# Patient Record
Sex: Female | Born: 1945 | Race: White | Hispanic: No | Marital: Single | Smoking: Never smoker
Health system: Southern US, Community
[De-identification: ages and names within clinical notes are randomized; demographics above are authoritative.]

## PROBLEM LIST (undated history)

## (undated) DIAGNOSIS — S060X9A Concussion with loss of consciousness of unspecified duration, initial encounter: Secondary | ICD-10-CM

## (undated) DIAGNOSIS — F0390 Unspecified dementia without behavioral disturbance: Secondary | ICD-10-CM

## (undated) DIAGNOSIS — I10 Essential (primary) hypertension: Secondary | ICD-10-CM

## (undated) DIAGNOSIS — S060XAA Concussion with loss of consciousness status unknown, initial encounter: Secondary | ICD-10-CM

## (undated) DIAGNOSIS — I4719 Other supraventricular tachycardia: Secondary | ICD-10-CM

## (undated) DIAGNOSIS — M1990 Unspecified osteoarthritis, unspecified site: Secondary | ICD-10-CM

## (undated) DIAGNOSIS — I471 Supraventricular tachycardia: Secondary | ICD-10-CM

## (undated) DIAGNOSIS — I495 Sick sinus syndrome: Secondary | ICD-10-CM

## (undated) HISTORY — DX: Essential (primary) hypertension: I10

## (undated) HISTORY — DX: Sick sinus syndrome: I49.5

## (undated) HISTORY — DX: Other supraventricular tachycardia: I47.19

## (undated) HISTORY — DX: Supraventricular tachycardia: I47.1

---
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
## 1968-01-26 HISTORY — PX: BREAST CYST EXCISION: SHX579

## 1970-01-25 HISTORY — PX: BREAST CYST EXCISION: SHX579

## 1987-01-26 HISTORY — PX: CYST EXCISION: SHX5701

## 2001-06-15 ENCOUNTER — Encounter: Payer: Self-pay | Admitting: Emergency Medicine

## 2001-06-15 ENCOUNTER — Emergency Department (HOSPITAL_COMMUNITY): Admission: EM | Admit: 2001-06-15 | Discharge: 2001-06-15 | Payer: Self-pay | Admitting: Emergency Medicine

## 2001-06-28 ENCOUNTER — Emergency Department (HOSPITAL_COMMUNITY): Admission: EM | Admit: 2001-06-28 | Discharge: 2001-06-28 | Payer: Self-pay | Admitting: *Deleted

## 2005-04-15 ENCOUNTER — Ambulatory Visit (HOSPITAL_COMMUNITY): Admission: RE | Admit: 2005-04-15 | Discharge: 2005-04-15 | Payer: Self-pay | Admitting: Pulmonary Disease

## 2005-06-25 ENCOUNTER — Ambulatory Visit (HOSPITAL_COMMUNITY): Admission: RE | Admit: 2005-06-25 | Discharge: 2005-06-25 | Payer: Self-pay | Admitting: Pulmonary Disease

## 2006-05-24 ENCOUNTER — Ambulatory Visit (HOSPITAL_COMMUNITY): Admission: RE | Admit: 2006-05-24 | Discharge: 2006-05-24 | Payer: Self-pay | Admitting: Pulmonary Disease

## 2006-08-14 ENCOUNTER — Emergency Department (HOSPITAL_COMMUNITY): Admission: EM | Admit: 2006-08-14 | Discharge: 2006-08-14 | Payer: Self-pay | Admitting: Emergency Medicine

## 2006-10-04 ENCOUNTER — Encounter: Payer: Self-pay | Admitting: Pulmonary Disease

## 2006-10-05 ENCOUNTER — Inpatient Hospital Stay (HOSPITAL_COMMUNITY): Admission: AD | Admit: 2006-10-05 | Discharge: 2006-10-07 | Payer: Self-pay | Admitting: *Deleted

## 2006-10-06 HISTORY — PX: PERMANENT PACEMAKER INSERTION: SHX6023

## 2006-11-14 ENCOUNTER — Ambulatory Visit (HOSPITAL_COMMUNITY): Admission: RE | Admit: 2006-11-14 | Discharge: 2006-11-14 | Payer: Self-pay | Admitting: Pulmonary Disease

## 2006-11-24 ENCOUNTER — Emergency Department (HOSPITAL_COMMUNITY): Admission: EM | Admit: 2006-11-24 | Discharge: 2006-11-24 | Payer: Self-pay | Admitting: Emergency Medicine

## 2007-10-30 IMAGING — CR DG FINGER THUMB 2+V*R*
1 series · 1 of 1 positions shown · non-contrast
Comparison: None.

CLINICAL DATA: 60-year-old, right wrist and thumb pain.  Fell.
 RIGHT WRIST ? 4 VIEW:

[view not recorded]
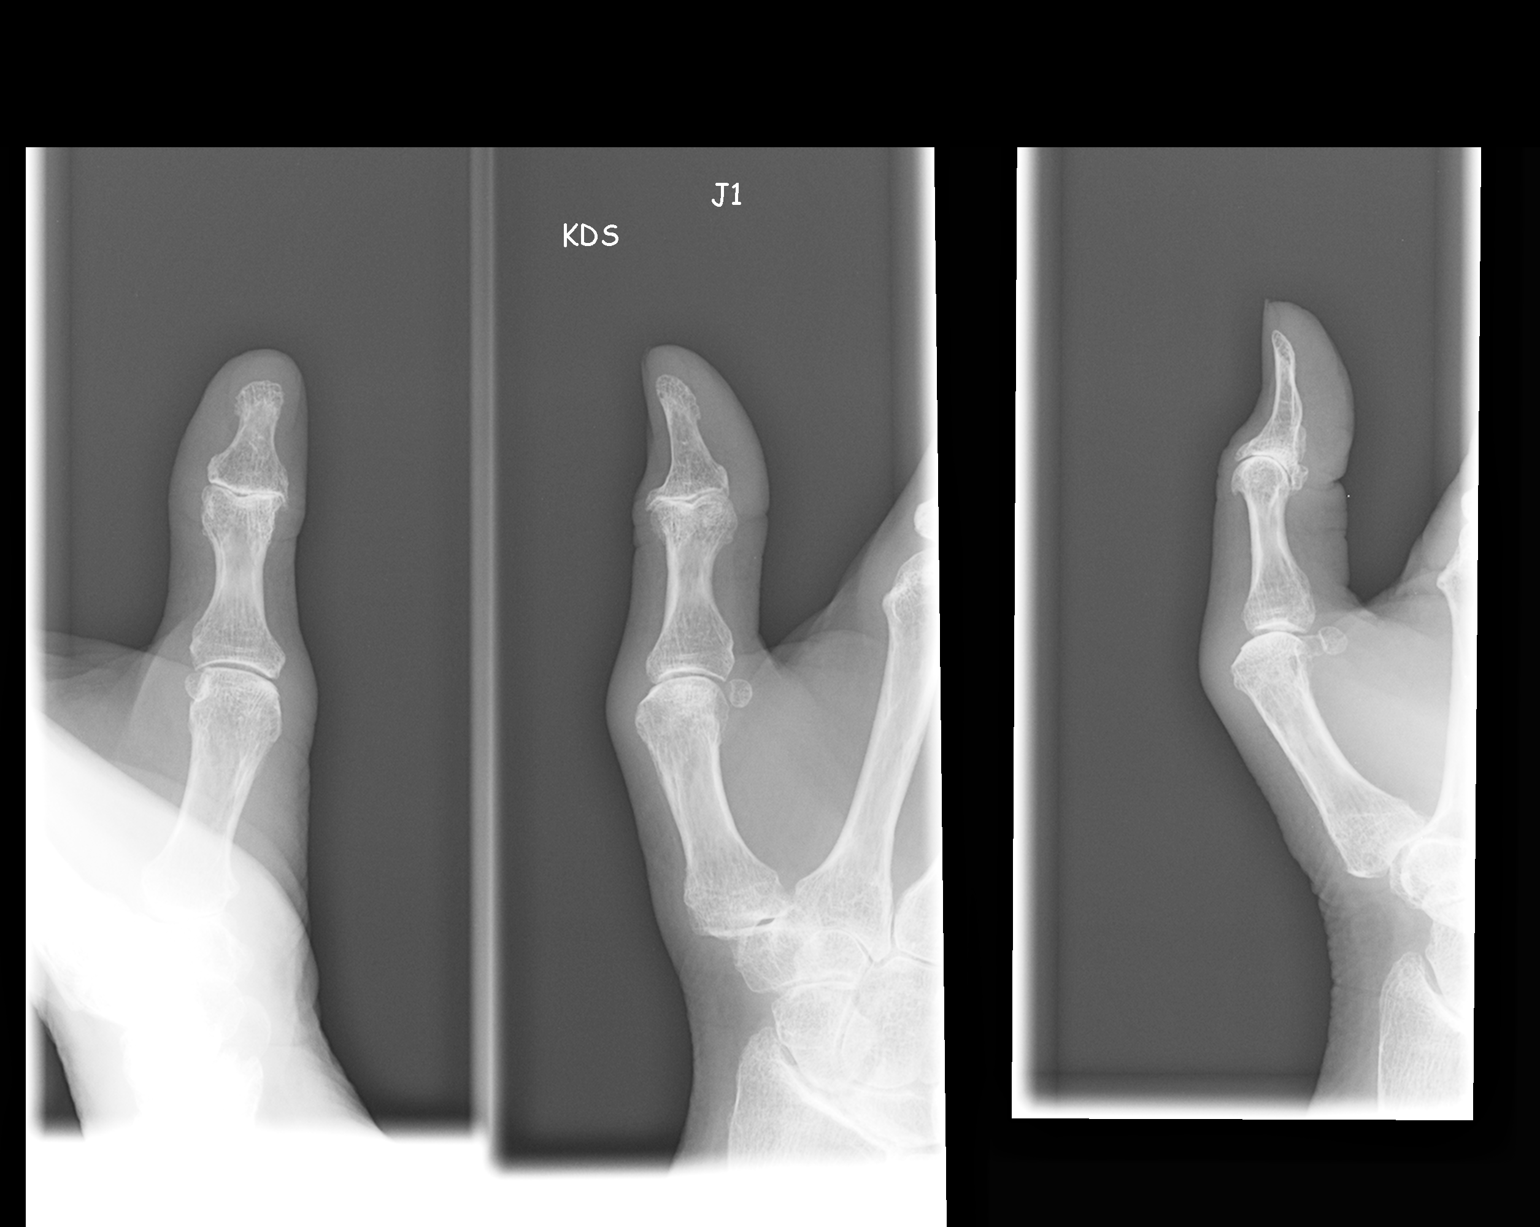

[1 of 1 positions shown; findings below may reference images not displayed]

FINDINGS: There are radiocarpal and intracarpal degenerative changes.  No acute bony findings.   Mild scapholunate joint space widening may suggest ligamentous insufficiency or laxity.  On the lateral film there is volar tilt of the lunate in relation to the capitate and radius which may suggest volar intercalated segmental instability. 
 No acute bony findings.
IMPRESSION: 1.  Degenerative changes as discussed above.
 2.  Volar intercalated segmental instability (VISI).  
 3.  Scapholunate joint space widening suggesting ligamentous laxity or insufficiency.
 4.  No acute bony findings.
 RIGHT THUMB ? 3 VIEW:
FINDINGS: Advanced degenerative change at the interphalangeal joint of the thumb and mild degenerative changes at the carpometacarpal joint of the thumb.   No fractures are seen.
IMPRESSION: Degenerative changes but no acute bony findings.

## 2007-10-30 IMAGING — CR DG WRIST COMPLETE 3+V*R*
2 series · 2 of 2 positions shown · non-contrast
Comparison: None.

CLINICAL DATA: 60-year-old, right wrist and thumb pain.  Fell.
 RIGHT WRIST ? 4 VIEW:

[view not recorded (1 of 2)]
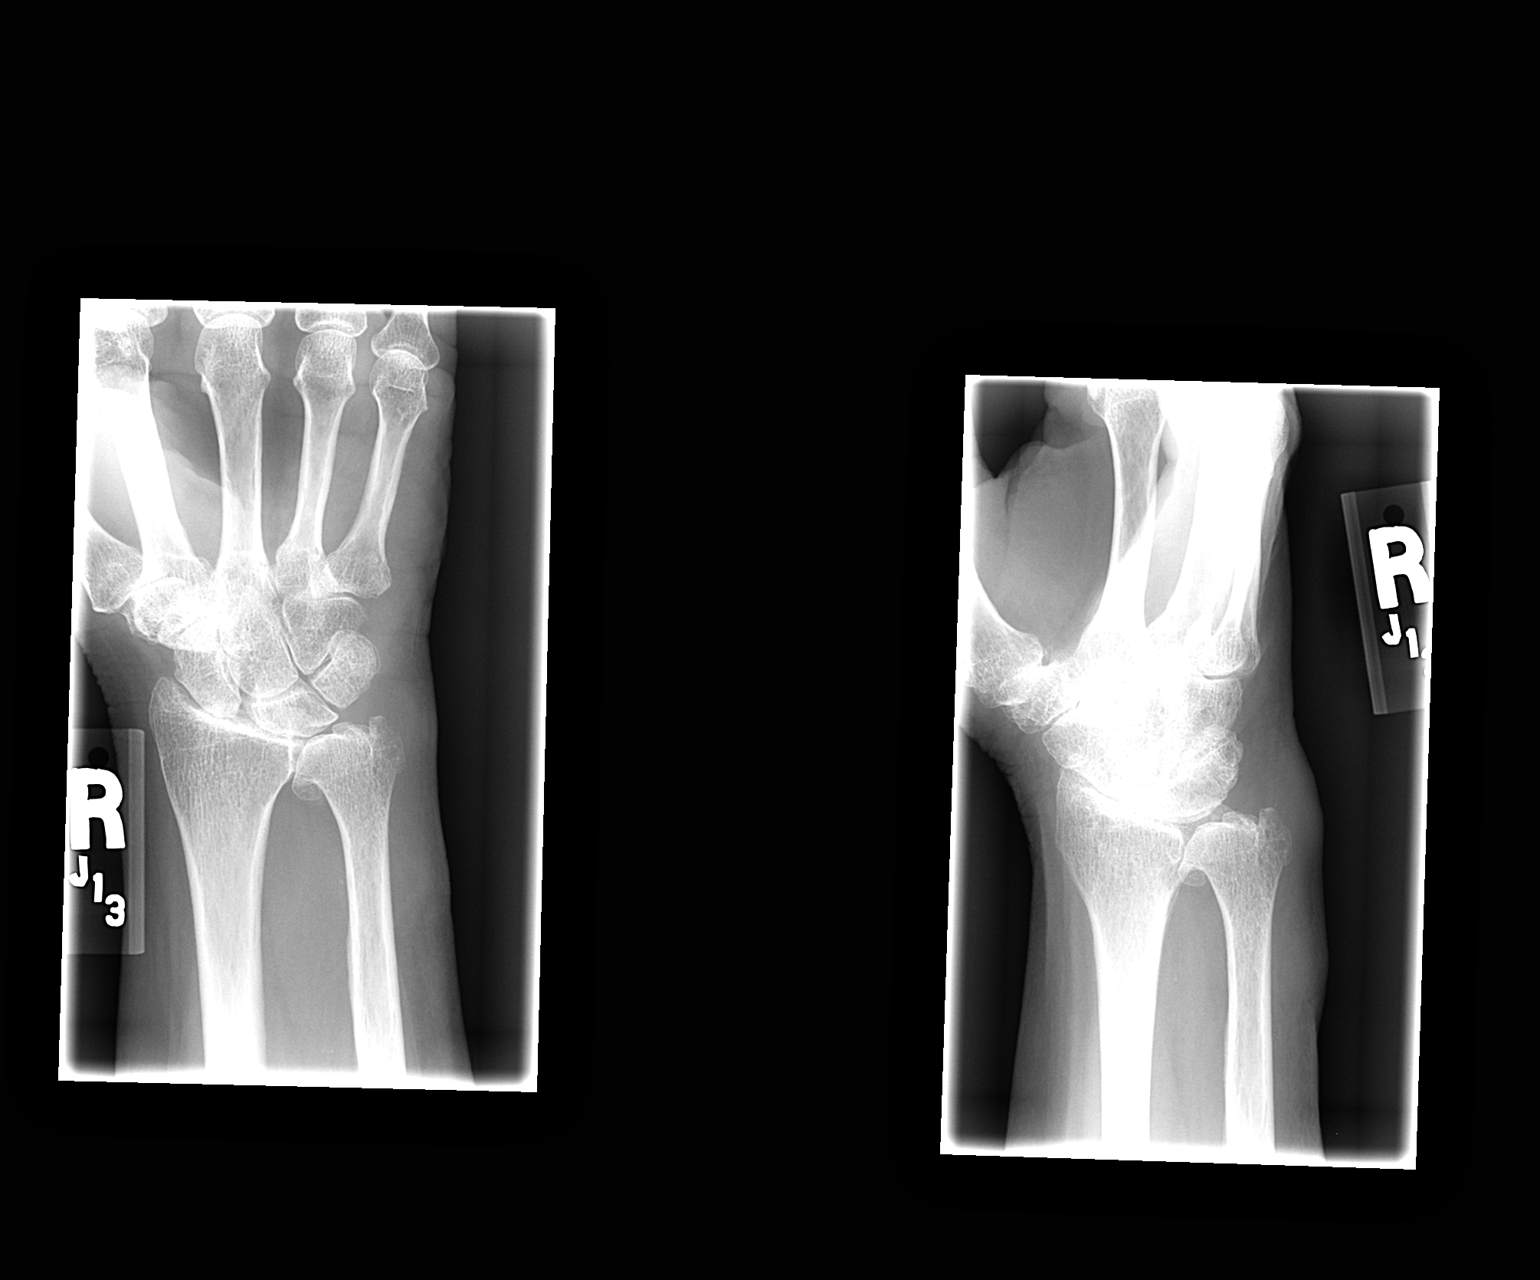

[view not recorded (2 of 2)]
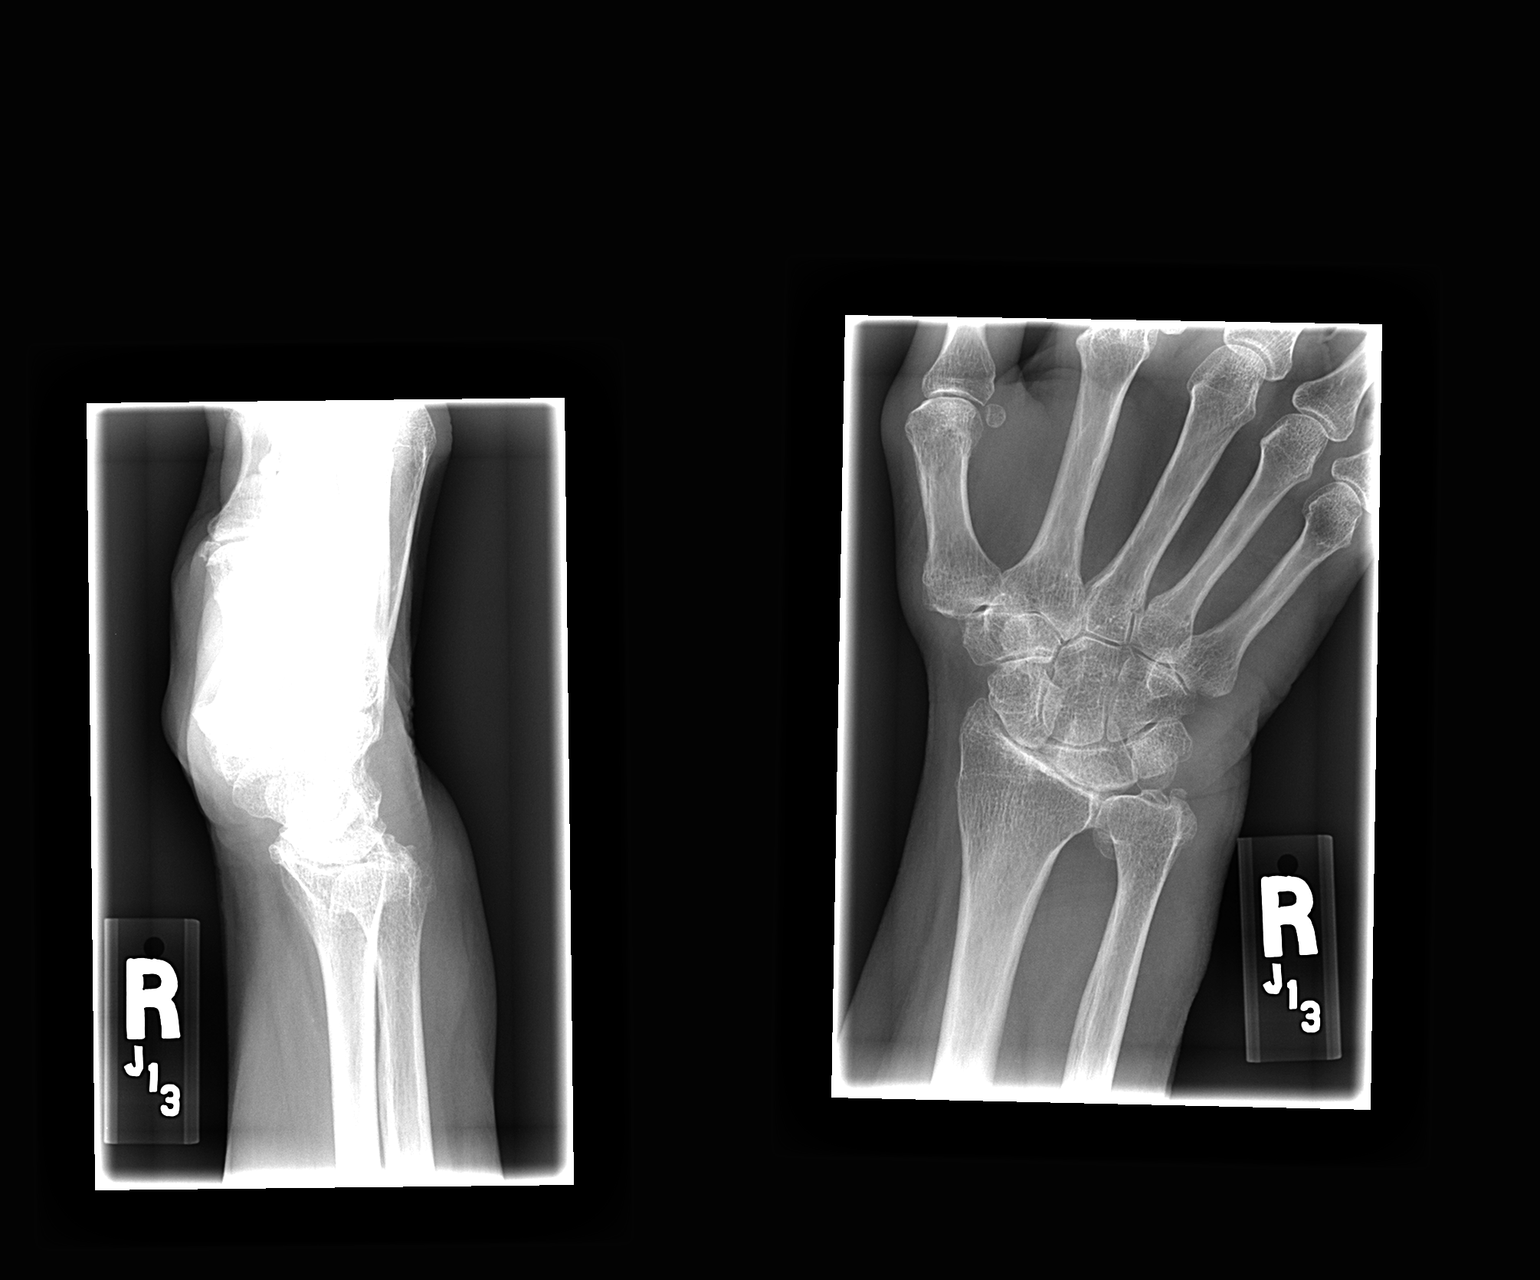

[2 of 2 positions shown; findings below may reference images not displayed]

FINDINGS: There are radiocarpal and intracarpal degenerative changes.  No acute bony findings.   Mild scapholunate joint space widening may suggest ligamentous insufficiency or laxity.  On the lateral film there is volar tilt of the lunate in relation to the capitate and radius which may suggest volar intercalated segmental instability. 
 No acute bony findings.
IMPRESSION: 1.  Degenerative changes as discussed above.
 2.  Volar intercalated segmental instability (VISI).  
 3.  Scapholunate joint space widening suggesting ligamentous laxity or insufficiency.
 4.  No acute bony findings.
 RIGHT THUMB ? 3 VIEW:
FINDINGS: Advanced degenerative change at the interphalangeal joint of the thumb and mild degenerative changes at the carpometacarpal joint of the thumb.   No fractures are seen.
IMPRESSION: Degenerative changes but no acute bony findings.

## 2008-02-09 ENCOUNTER — Ambulatory Visit (HOSPITAL_COMMUNITY): Admission: RE | Admit: 2008-02-09 | Discharge: 2008-02-09 | Payer: Self-pay | Admitting: Pulmonary Disease

## 2008-07-11 ENCOUNTER — Ambulatory Visit (HOSPITAL_COMMUNITY): Admission: RE | Admit: 2008-07-11 | Discharge: 2008-07-11 | Payer: Self-pay | Admitting: Pulmonary Disease

## 2009-06-03 HISTORY — PX: US ECHOCARDIOGRAPHY: HXRAD669

## 2009-06-04 HISTORY — PX: NM MYOCAR PERF WALL MOTION: HXRAD629

## 2009-06-27 ENCOUNTER — Inpatient Hospital Stay (HOSPITAL_COMMUNITY)
Admission: EM | Admit: 2009-06-27 | Discharge: 2009-07-03 | Payer: Self-pay | Source: Home / Self Care | Admitting: Emergency Medicine

## 2009-07-01 ENCOUNTER — Ambulatory Visit: Payer: Self-pay | Admitting: Gastroenterology

## 2009-07-02 ENCOUNTER — Ambulatory Visit: Payer: Self-pay | Admitting: Internal Medicine

## 2009-07-02 ENCOUNTER — Encounter (INDEPENDENT_AMBULATORY_CARE_PROVIDER_SITE_OTHER): Payer: Self-pay | Admitting: Pulmonary Disease

## 2009-08-26 ENCOUNTER — Encounter (INDEPENDENT_AMBULATORY_CARE_PROVIDER_SITE_OTHER): Payer: Self-pay | Admitting: *Deleted

## 2009-08-27 ENCOUNTER — Encounter (INDEPENDENT_AMBULATORY_CARE_PROVIDER_SITE_OTHER): Payer: Self-pay

## 2009-09-16 ENCOUNTER — Ambulatory Visit: Payer: Self-pay | Admitting: Internal Medicine

## 2009-09-16 ENCOUNTER — Encounter: Payer: Self-pay | Admitting: Gastroenterology

## 2009-09-16 DIAGNOSIS — R740 Nonspecific elevation of levels of transaminase and lactic acid dehydrogenase [LDH]: Secondary | ICD-10-CM

## 2009-09-16 DIAGNOSIS — K279 Peptic ulcer, site unspecified, unspecified as acute or chronic, without hemorrhage or perforation: Secondary | ICD-10-CM | POA: Insufficient documentation

## 2009-09-16 DIAGNOSIS — K210 Gastro-esophageal reflux disease with esophagitis: Secondary | ICD-10-CM

## 2009-09-16 DIAGNOSIS — R7401 Elevation of levels of liver transaminase levels: Secondary | ICD-10-CM | POA: Insufficient documentation

## 2009-09-16 DIAGNOSIS — K449 Diaphragmatic hernia without obstruction or gangrene: Secondary | ICD-10-CM | POA: Insufficient documentation

## 2009-09-30 ENCOUNTER — Telehealth (INDEPENDENT_AMBULATORY_CARE_PROVIDER_SITE_OTHER): Payer: Self-pay

## 2009-12-08 ENCOUNTER — Encounter (INDEPENDENT_AMBULATORY_CARE_PROVIDER_SITE_OTHER): Payer: Self-pay

## 2009-12-10 ENCOUNTER — Telehealth (INDEPENDENT_AMBULATORY_CARE_PROVIDER_SITE_OTHER): Payer: Self-pay

## 2009-12-10 ENCOUNTER — Encounter: Payer: Self-pay | Admitting: Gastroenterology

## 2009-12-24 ENCOUNTER — Encounter: Payer: Self-pay | Admitting: Internal Medicine

## 2009-12-29 ENCOUNTER — Telehealth (INDEPENDENT_AMBULATORY_CARE_PROVIDER_SITE_OTHER): Payer: Self-pay

## 2010-01-07 ENCOUNTER — Telehealth (INDEPENDENT_AMBULATORY_CARE_PROVIDER_SITE_OTHER): Payer: Self-pay

## 2010-01-08 ENCOUNTER — Telehealth (INDEPENDENT_AMBULATORY_CARE_PROVIDER_SITE_OTHER): Payer: Self-pay

## 2010-01-08 ENCOUNTER — Ambulatory Visit (HOSPITAL_COMMUNITY)
Admission: RE | Admit: 2010-01-08 | Discharge: 2010-01-08 | Payer: Self-pay | Source: Home / Self Care | Attending: Internal Medicine | Admitting: Internal Medicine

## 2010-02-24 NOTE — Miscellaneous (Signed)
Summary: Orders Update  Clinical Lists Changes  Orders: Added new Test order of T-Hepatic Function ([id number]) - Signed 

## 2010-02-24 NOTE — Progress Notes (Signed)
Summary: phone note  Phone Note Call from Patient   Caller: Patient Reason for Call: Refill Medication Summary of Call: PATIENT STATES THAT LESLIE LEWIS GAVE HER SAMPLES OF NEXIUM BECAUSE THE OTHER MEDICATION PANTOPRAZOLE MADE HER HAVE HEART BURN AND THE NEXIUM IS WORKING AND WOULD LIKE TO HAVE A PRESCRIPTION FOR NEXIUM CALLED INTO HER PHARMACY WHICH IS [facility] IN [place].  Initial call taken by: Rosine Beat,  September 30, 2009 2:47 PM     Appended Document: phone note-rx nexium    Prescriptions: NEXIUM 40 MG CPDR (ESOMEPRAZOLE MAGNESIUM) one by mouth 30 mins before breakfast daily  #30 x 11   Entered and Authorized by:   Leanna Battles. Dixon Boos   Signed by:   Leanna Battles Dixon Boos on 10/03/2009   Method used:   Electronically to        [facility] (retail)       [address]       [place], [place]  [address]       Ph: [phone]       Fax: [phone]   RxID:   [id number]

## 2010-02-24 NOTE — Progress Notes (Signed)
Summary: phone note/ do LFT's and triaged for EGD  Phone Note Call from Patient   Caller: Patient Summary of Call: Pt  called for plan of care. She is supposed to do LFT's. I am mailing lab order to her and she will do at PCP's next week. Also,  triaged for EGD, will let Dr. Jena Gauss sign off and call her back with date if she doesn't need OV first.   Initial call taken by: Cloria Spring LPN,  December 10, 2009 2:26 PM

## 2010-02-24 NOTE — Letter (Signed)
Summary: Plan of Care, Need to Discuss  [facility]  [address]   [place], [place] [address]   Phone: [phone]  Fax: [phone]    December 08, 2009  Melanie [name] [address] [place], [place]  [address] [birth year]   Dear [name],   We are writing this letter to inform you of treatment plans and/or discuss your plan of care.  We have tried several times to contact you; however, we have yet to reach you.  We ask that you please contact our office for follow-up on your gastrointestinal issues.  We can  be reached at [phone] to schedule an appointment, or to speak with someone regarding your health care needs.  Please do not neglect your health.   Sincerely,    Cloria Spring LPN  [facility] Ph: [phone]    Fax: [phone]

## 2010-02-24 NOTE — Letter (Signed)
Summary: EGD ORDER/TRIAGE  EGD ORDER/TRIAGE   Imported By: Rexene Alberts 12/24/2009 12:25:46  _____________________________________________________________________  External Attachment:    Type:   Image     Comment:   External Document

## 2010-02-24 NOTE — Progress Notes (Signed)
Summary: phone note/ rescheduled EGD  Phone Note Call from Patient   Caller: Patient Summary of Call: Pt called and wanted to reschedule her EGD from 01/06/2010. She is rescheduled at 1:15pm on 01/08/2010. Changed in IDX and LMOM for Kim in Endo. Initial call taken by: Cloria Spring LPN,  December 29, 2009 11:30 AM

## 2010-02-24 NOTE — Miscellaneous (Signed)
Summary: CT A/P 6/11  CT Abd/Pelvis W CM - STATUS: Final  IMAGE                                     Perform Date: 7 Jun11 10:29  Ordered By: Juanetta Gosling MD , Oneal Deputy         Ordered Date: 7 Jun11 08:05  Facility: [facility]                               Department: CT  Service Report Text  [facility] Accession Number: [id number]      Clinical Data: Confusion.  Fever.  Weight loss and diarrhea.    CT ABDOMEN AND PELVIS WITH CONTRAST    Technique:  Multidetector CT imaging of the abdomen and pelvis was   performed following the standard protocol during bolus   administration of intravenous contrast.    Contrast: 100 ml Omnipaque-300    Comparison: CT scan 08/14/2006    Findings: The lung bases are clear except for a small left effusion   and minimal overlying atelectasis.  There is a large stable hiatal   hernia.    There is diffuse fatty infiltration of the liver but no focal   hepatic lesions or biliary dilatation.  Gallbladder appears normal.   The pancreas, spleen, adrenal glands and kidneys are unremarkable   and stable.    The stomach, duodenum and small bowel are unremarkable.  There are   patchy areas of colonic inflammation involving portions of the   ascending, transverse and descending colon.  Some involvement of   the rectosigmoid area is also noted.  This could reflect   inflammatory bowel disease or an infectious process.    The aorta is normal in caliber.  The major branch vessels are   normal.  No mesenteric or retroperitoneal masses or adenopathy.    The uterus and ovaries are unremarkable.  The bladder appears   normal.  No pelvic mass or adenopathy.  No inguinal mass or hernia.    The bony structures are unremarkable.    IMPRESSION:    1.  Segmental areas of inflammation involving the colon may reflect   inflammatory bowel disease or infectious process.   2.  Small left effusion and minimal overlying atelectasis.   3.  Large hiatal hernia.   4.  Diffuse fatty  infiltration of the liver.   5.  No mass lesions or adenopathy.    Read By:  Cyndie Chime,  M.D.   Released By:  Cyndie Chime,  M.D.  Additional Information  HL7 RESULT STATUS : F  External image : [id number]  External IF Update Timestamp : 2009-07-01:10:54:28.000000 Clinical Lists Changes

## 2010-02-24 NOTE — Op Note (Signed)
Summary: EGD/TCS report  NAME:  Melanie [name], Melanie [name]               ACCOUNT NO.:  [id number]      MEDICAL RECORD NO.:  [id number]         PATIENT TYPE:  PINP      LOCATION:  A206                          FACILITY:  [facility]      PHYSICIAN:  R. Roetta Sessions, M.D. DATE OF BIRTH:  25-Mar-1945      DATE OF PROCEDURE:   DATE OF DISCHARGE:                                  OPERATIVE REPORT      PROCEDURE:  Esophagogastroduodenoscopy with gastric biopsy, followed by   ileocolonoscopy, biopsy, and stool sampling.      INDICATIONS FOR PROCEDURE:  A 63-year lady with anorexia, weight loss,   history of NSAID use, recent diarrhea, CT suggesting a patchy colitis.      LABORATORY DATA:  From this morning:  She is not anemic with hemoglobin   and hematocrit of 12.5 and 37.0, white count 8.4.  Lactoferrin on stool   came back positive.  C. diff came back negative and a culture   reincubated for better growth.  Mildly elevated aminotransferase is   noted today with AST and ALT of 63 and 39 respectively, total bilirubin   1.1.  H. pylori serologies came back negative at 0.4.      EGD and colonoscopy are now being done.  Risks, benefits, alternatives,   and limitations have been discussed and questions answered.  Please see   the documentation in the medical record.      PROCEDURE NOTE:  O2 saturation, blood pressure, pulse, and respirations   were monitored throughout the entire procedure.  Conscious sedation:   Versed 4 mg IV, Demerol 100 mg IV in divided doses.  Cetacaine spray for   topical oropharyngeal anesthesia.  Instrument:  Pentax video chip   system.      FINDINGS:  EGD:  Examination of tubular esophagus revealed   circumferential distal esophageal erosions coming up 1 to 2 cm from the   GE junction, consistent with erosive reflux esophagitis.  There was a   noncritical Schatzki ring present.  Otherwise tubular esophagus appeared   unremarkable.  The GE junction remained widely patent and was  easily   traversed with the scope.      Stomach:  The gastric cavity insufflated well with air.  Thorough   examination of gastric mucosa, including retroflexion of the proximal   stomach and esophagogastric junction demonstrated a moderately large   hiatal hernia.  The patient had gastric erosions involving the antrum   and the cardia up in the hernia sac.  There was 1 area that was actually   a 3-mm area of ulceration in the prepyloric antral mucosa.  I did not   see an infiltrating process or other abnormality.  The pylorus was   patent, easily traversed.  The mucosa of the bulb was diffusely eroded   without ulcer or infiltrating process seen.  Otherwise D1 and D2   appeared unremarkable.  Therapeutic/diagnostic maneuver was performed.   The area of inflammation in the antrum and up in the cardia were   biopsied for  histologic study.  The patient tolerated the procedure well   and was prepared for colonoscopy.      Digital rectal exam revealed no abnormalities.      Endoscopic findings:  Prep was adequate.      Colon:  The colonic mucosa was surveyed from the rectosigmoid junction   through the left, transverse, right colon to the appendiceal orifice and   ileocecal valve/cecum.  These structures were well seen and photographed   for the record.  The terminal ileum was intubated to 10 cm.  From this   level, the scope was slowly and cautiously withdrawn.  All previously   mentioned mucosal surfaces were again seen.  The colonic mucosa really   looked good.  There were some areas where the mucosa appeared somewhat   pale in the descending and ascending segments, but there were no   erosions, ulcers, polyp disease, or evidence of neoplasia.  There was   minimal increased friability.  There was, however, preservation of the   normal vascular pattern throughout the colon.  I did not see any   granularity whatsoever.  A stool sample was submitted for a second C.   diff toxin assay and  repeat stool culture.  I elected to perform   segmental biopsies of the ascending and descending segments.  Otherwise   colonic mucosa appeared unremarkable.  The scope was pulled down into   the rectum, where thorough examination of the rectal mucosa, including   retroflexed view of the anal verge demonstrated normal-appearing mucosa.   I elected to biopsy the rectal mucosa as well.  The patient tolerated   both procedures well.  Cecal withdrawal time 8 minutes.      IMPRESSIONS:   1. Circumferential distal esophageal erosions consistent with erosive       reflux esophagitis, noncritical ring, not manipulated, moderately       large hiatal hernia, small ulcer in the prepyloric antral mucosa       with satellite erosions, gastric erosions in the cardia as well,       status post biopsy.  Patent pylorus.  Bulbar erosions.  Otherwise       D1 and D2 appeared normal.   2. Colonoscopy findings:  Normal rectum, subtly pale mucosa involving       the ascending and descending segments.  This is really not very       impressive.  Otherwise unremarkable rectum and colon, status post       segmental biopsy and stool sampling.  Normal terminal ileum.      The abnormalities seen in the gastric mucosa as well as the colon could   easily be explained by intermittent NSAID use.  A resolving infectious   process is certainly also possible.      RECOMMENDATIONS:   1. Follow up on stool studies and path.   2. Low-residue diet.   3. Hepatic function profile to follow up on her elevated AST and ALT       tomorrow morning.   4. Begin acid suppression therapy with a PPI.   5. Further recommendations to follow.               Jonathon Bellows, M.D.   Appended Document: EGD/TCS report need consult note into emr  Appended Document: EGD/TCS report in emr

## 2010-02-24 NOTE — Miscellaneous (Signed)
Summary: consult note  Clinical Lists Changes  NAME:  Melanie [name], Melanie [name]               ACCOUNT NO.:  [id number]      MEDICAL RECORD NO.:  [id number]          PATIENT TYPE:  INP      LOCATION:  A206                          FACILITY:  [facility]      PHYSICIAN:  Lorenza Burton, N.P.    DATE OF BIRTH:  01/25/1946      DATE OF CONSULTATION:   DATE OF DISCHARGE:                                    CONSULTATION      REQUESTING PHYSICIAN:  Dr. Darrick Penna.      PRIMARY CARE PHYSICIAN:  Dr. Juanetta Gosling.      REASON FOR CONSULTATION:  Anorexia, weight loss and acute diarrhea.      HISTORY OF PRESENT ILLNESS:  Melanie [name] is a 65 year old Caucasian   female.  She has history of rheumatoid arthritis and is under treatment   by Dr. Azzie Roup.  She had been on a steady drug that she called   DFOS for approximately 6 weeks now.  She was found by police driving   circles and quite confused.  She has amnesia and no recollection of what   she was doing at that time.  She believes she was headed to [employer] to   get some potting soil.  She tells me she had a fever of 103.  She has   also been quite fatigued over the last couple of weeks.  She did have   some transient thrombocytopenia.  Over the last week, she has developed   diarrhea.  She is having anywhere from 3-4 loose nonbloody stools daily.   She denies any abdominal pain.  Denies any nausea, vomiting.  Denies any   headache.  She has had anorexia.  She believes she has lost about 8-10   pounds over the last year.  This is without change in diet or activity   level.  She occasionally has heartburn and indigestion.  She takes   Rolaids if needed very sporadically.  She also takes an as-needed Aleve   for her rheumatoid arthritis but tells me this is very rare.  She is on   chronic methotrexate for her RA as well.  She denies any dysphagia or   odynophagia.  She did find a tick in her hair approximately 5 days ago.   She has not developed a rash.  She tells me  it was not attached.  She   denies any other new medications.      PAST MEDICAL HISTORY:  Rheumatoid arthritis followed by Dr. Azzie Roup on study drug called DFOS for the last 6 weeks.  She did have   transient thrombocytopenia felt to be related to this.  She is followed   by Dr. Lynnea Ferrier, [facility], with a history of pacemaker   placement and Stokes-Adams attack.  She has never had a colonoscopy.   She has had benign breast biopsies bilaterally.  She had an appendectomy   in seventh grade.      MEDICATIONS PRIOR TO ADMISSION:  Folic acid 1 mg  q.h.s., methotrexate   2.5 mg eight weekly, Aleve 220 mg one to two b.i.d. p.r.n., generally   only takes one once weekly, DFOS study drug.      ALLERGIES:  HYDROCODONE CAUSES NAUSEA AND VOMITING.      FAMILY HISTORY:  There is no known family history of colorectal   carcinoma.  She did lose a brother at age 75 with esophageal carcinoma.   She has one sister with history of coronary artery disease who is alive.   She lost her mother in her 76s with history of RA.  Father deceased in   his 66s with coronary artery disease.      SOCIAL HISTORY:  Ms. Such is single.  She has never been married.  She   lives alone.  She does not have any children.  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  She denies any   tobacco use.  She occasionally consumes an alcoholic beverage socially.   Denies any drug use.      REVIEW OF SYSTEMS:  See HPI.  Over the last couple of weeks, she has had   heaviness to both of her lower extremities.  She did report this to her   rheumatologist.  She also had transient thrombocytopenia.  She has had   some bruising.  CONSTITUTIONAL:  She has had some fatigue and malaise.   Otherwise, negative review of systems.      PHYSICAL EXAMINATION:  VITAL SIGNS:  Temperature 98.9, pulse 78,    respirations 18, blood pressure 115/68, O2 SAT 92% on room air.   GENERAL:  She is a pale-appearing Caucasian female who is alert,   oriented, pleasant and cooperative, and in no acute distress.   HEENT:  Sclerae clear.  Conjunctiva pale.  Oropharynx pink and moist.   NECK:  Supple without any masses or thyromegaly.   CHEST:  Heart rate is irregular.  She has a pacemaker in place.   LUNGS:  Clear to auscultation bilaterally.   ABDOMEN:  Positive bowel sounds x4.  No bruits auscultated.  Abdomen is   soft, nontender, nondistended without palpable mass or   hepatosplenomegaly.  No rebound tenderness or guarding.   EXTREMITIES:  She does have trace pretibial edema.  She does have   chronic arthritic changes in her hands; also, her left AC has   ecchymosis, erythema and slight warmth.      LABORATORY STUDIES:  From June 28, 2009, she had a hemoglobin of 12.8,   hematocrit 37.5, platelets 157,000.  White blood cell count 7.4, calcium   7.7, sodium 135, potassium 3.1, chloride 106, CO2 21, BUN 5, creatinine   0.79 and glucose 101.  Stool cultures negative from June 4 thus far,   blood culture no growth for 3 days.  Urine culture no growth.  Lactic   acid was 1.7.      IMAGING STUDIES:  She had a noncontrast head CT on June 27, 2009;   progressive small vessel white matter ischemic changes in both cerebral   hemispheres and stable mild diffuse cerebral and cerebellar atrophy.  No   acute changes.  She had a portable chest x-ray June 27, 2009; nonspecific   upper limit of normal caliber, left upper quadrant small bowel loops.      IMPRESSION:  Melanie [name] is a 65 year old Caucasian female who  was   admitted with an episode of global amnesia.  She is being worked up by   Dr. Gerilyn Pilgrim with  possible complex partial seizures, cerebral ischemic   event or medication effect from her study drug DFOS.  She has had an   approximate 8-10 pound weight loss in the last year, anorexia, and a 1-   week  history of acute diarrheal febrile illness.  She also has a history   of tick exposure but no evidence of rash or attachment, making this less   likely.  She has never had a colonoscopy nor EGD.  Given her history of   weight loss and anorexia, she is going to need further workup.  She is   scheduled for CT scan of abdomen and pelvis with contrast today.   Pending these findings, would consider complete workup with colonoscopy   and EGD by Dr. Jonette Eva which can probably be done tomorrow if   nothing to explain her diarrhea illness and weight loss is found on CT.   Differentials are wide for her diarrhea which include side effect of   study drug, acute bacterial or viral illness, celiac disease.   Colorectal carcinoma would be less likely and side effect from   medication remains high on the differential.      PLAN:   1. Check CMP and CBC in the morning.  We will follow up on CT scan of       the abdomen and pelvis with contrast, which is being done today.   2. Plan on colonoscopy and EGD in the near future, possibly tomorrow       pending CT results.   3. Continue supportive measures.      Thank you for allowing [place] to participate in the care of Ms. Inabinet.               Lorenza Burton, N.P.            KJ/MEDQ  D:  07/01/2009  T:  07/01/2009  Job:  [id number]      cc:   Ramon Dredge L. Juanetta Gosling, M.D.   Fax: [phone]      Kofi A. Gerilyn Pilgrim, M.D.   Fax: [phone]      Ritta Slot, MD   Fax: [phone]

## 2010-02-24 NOTE — Assessment & Plan Note (Signed)
Summary: hos fu with extender to review gi issues per RMR/ss   Visit Type:  f/u  Primary Care Provider:  Shaune [name]  Chief Complaint:  follow up from hosp.  History of Present Illness: Melanie [name] is here for f/u visit. In 6/11, she presented to [facility] with fever 103, confusion. CT suggest colitis. She has been on new med for six weeks for RA, DFOS, in addition to her methotrexate. She had EGD/TCS.  She had circumferential distal esophageal erosions consistent with erosive reflux esophagitis, noncritical ring, not manipulated, moderately large hiatal hernia, small ulcer in the prepyloric antral mucosa with satellite erosions, gastric erosions in the cardia as well, status post biopsy.  Patent pylorus.  Bulbar erosions.  Otherwise D1 and D2 appeared normal.  Colonoscopy findings:  Normal rectum, subtly pale mucosa involving the ascending and descending segments.  This is really not very impressive. Otherwise unremarkable rectum and colon, status post segmental biopsy and stool sampling.  Normal terminal ileum.  The abnormalities seen in the gastric mucosa as well as the colon could easily be explained by intermittent NSAID use.  A resolving infectious process certainly also possible. Bx all negative. Four stools cultures done, one grew Candida. Three negative C.Diffs.     Feels buch better now. BM regular. Some days, diminshed appetite, but other days real good. No N/V. No regurgitation. Pantoprazole "caused" heartburn so she stopped it. No dysphagia/odynophagia. DFOS stopped after hospitalization. Sees Dr. Dareen [name] on Monday. Dr. Dareen [name] usually checks LFTs. To her knowledge, never up before.   Current Medications (verified): 1)  Methotrexate 2.5 Mg Tabs (Methotrexate Sodium) .... 8 Q Week 2)  Folic Acid 1 Mg Tabs (Folic Acid) .... Once Daily  Allergies (verified): No Known Drug Allergies  Past History:  Past Medical History: Rheumatoid arthritis followed by Dr. Azzie [name]  Followed by Dr.  Lynnea [name], [facility], with a history of pacemaker placement and Stokes-Adams attack.    EGD/TCS 6/11--> Circumferential distal esophageal erosions consistent with erosive reflux esophagitis, noncritical ring, not manipulated, moderately large hiatal hernia, small ulcer in the prepyloric antral mucosa  with satellite erosions, gastric erosions in the cardia as well, status post biopsy (benign). Colonoscopy findings:  Normal rectum, subtly pale mucosa involving the ascending and descending segments.  Otherwise unremarkable rectum and colon, status post segmental biopsy and stool sampling (unremarkable).  Normal terminal ileum.      Past Surgical History: Benign breast biopsies bilaterally.  Appendectomy in seventh grade.   Family History: There is no known family history of colorectal carcinoma.  She did lose a brother at age 39 with esophageal carcinoma.  She has one sister with history of coronary artery disease who is alive. She lost her mother in her 82s with history of RA.  Father deceased in his 56s with coronary artery disease.      Social History:  [name] is single.  She has never been married.  She lives alone.  She does not have any children.  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  She denies any tobacco use.  She occasionally consumes an alcoholic beverage socially.  Denies any drug use.   Review of Systems      See HPI  Vital Signs:  Patient profile:   65 year old female Height:      60 inches Weight:      120 pounds BMI:  23.52 Temp:     97.6 degrees F oral Pulse rate:   76 / minute BP sitting:   108 / 78  (left arm) Cuff size:   regular  Vitals Entered By: Melanie [name] (September 16, 2009 9:04 AM)  Physical Exam  General:  Well developed, well nourished, no acute distress. Head:  Normocephalic and atraumatic. Eyes:  sclera nonicteric Mouth:   Oropharyngeal mucosa moist, pink.  No lesions, erythema or exudate.    Lungs:  Clear throughout to auscultation. Heart:  Regular rate and rhythm; no murmurs, rubs,  or bruits. Abdomen:  Bowel sounds normal.  Abdomen is soft, nontender, nondistended.  No rebound or guarding.  No hepatosplenomegaly, masses or hernias.  No abdominal bruits.  Extremities:  No clubbing, cyanosis, edema or deformities noted. Neurologic:  Alert and  oriented x4;  grossly normal neurologically. Skin:  Intact without significant lesions or rashes. Psych:  Alert and cooperative. Normal mood and affect.  Impression & Recommendations:  Problem # 1:  PUD (ICD-533.90)  Prepyloric ulcer possibly secondary to nsaids. Bx negative. She did not take pantoprazole due to "side effect of reflux". She needs to take PPI for at least two months. Avoid NSAIDS if possible. Start Nexium 40mg  by mouth daily. Samples provided, if tolerated she will call for RX. #20 given. Will discuss with Melanie [name], regarding if she needs to have EGD in three months to verify healing.   Orders: Est. Patient Level III (29562)  Problem # 2:  ESOPHAGITIS, REFLUX (ICD-530.11)  Assymptomatic. PPI for now. Unclear how long she will need treatement.   Orders: Est. Patient Level III (13086)  Problem # 3:  TRANSAMINASES, SERUM, ELEVATED (ICD-790.4) Mildly elevated during hospitalization. Will recheck and go from there. She plans to have labs done at Dr. Ewell [name] office on Monday. I have asked that she had copy sent to [place].  Orders: T-Hepatic Function [id number]) Est. Patient Level III (28413)  Appended Document: hos fu with extender to review gi issues per RMR/ss Never received LFTs. Check with Dr. Vincenza [name] Anderson's office, pt planned to have done there.  Appended Document: hos fu with extender to review gi issues per RMR/ss Melanie [name], Does she need f/u EGD to verify healing of small prepyloric ulcer? How long should she be on PPI? She had erosive  reflux esophagitis (without heartburn symptoms). Need chronic PPI?  Appended Document: hos fu with extender to review gi issues per RMR/ss yes  Appended Document: hos fu with extender to review gi issues per RMR/ss Requested LFTs again  Appended Document: hos fu with extender to review gi issues per RMR/ss No LFTs available from Dr. Ewell [name]. Please have patient do LFTs (secondary abnormal LFTs).  She also needs EGD to f/u PUD per RMR.   Appended Document: hos fu with extender to review gi issues per RMR/ss LMOM to call.  Appended Document: hos fu with extender to review gi issues per RMR/ss LMOM to call.  Appended Document: hos fu with extender to review gi issues per RMR/ss Letter mailed to call for plan of care.

## 2010-02-26 NOTE — Progress Notes (Signed)
----   Converted from flag ---- ---- 01/07/2010 2:54 PM, Jonathon Bellows MD, Caleen Essex wrote: no solid food after 6 PM day before procedure. Okay to have clear liquids until 10 AM date of procedure  ---- 01/07/2010 2:47 PM, Cloria Spring LPN wrote: Pt had to change her EGD to 3:00 tomorrow because of the OR schedule. She wants to know if she can have solid foods alittle  later than 6 pm and what time she can have last liquids? ------------------------------  Appended Document:  LMOM for pt to call, she can have clear liquids til 10 AM.  Appended Document:  Pt returned call and she said she did not eat after 6 pm last evening. She is aware she can have clear liquids until 10 AM today if she goes for the 3 PM appt.

## 2010-02-26 NOTE — Progress Notes (Signed)
Summary: phone note/ change in time for ENDO on 15th  Phone Note Other Incoming   Caller: [facility] Summary of Call: Selena Batten called and ask me to get in touch with pt and ask her to come in at 2:00 pm tomorrow and her appt would be at 3:00. Had to make some changes due to add on in OR earlier in the day. I LMOM for pt to do so, and to call and confirm that she got the message. Initial call taken by: Cloria Spring LPN,  January 07, 2010 2:32 PM

## 2010-04-13 LAB — CULTURE, BLOOD (ROUTINE X 2)
Culture: NO GROWTH
Report Status: 6082011
Report Status: 6082011

## 2010-04-13 LAB — CBC
HCT: 34.5 % — ABNORMAL LOW (ref 36.0–46.0)
Hemoglobin: 13.4 g/dL (ref 12.0–15.0)
MCHC: 33.7 g/dL (ref 30.0–36.0)
MCHC: 33.9 g/dL (ref 30.0–36.0)
MCV: 99.4 fL (ref 78.0–100.0)
MCV: 99.9 fL (ref 78.0–100.0)
Platelets: 157 10*3/uL (ref 150–400)
Platelets: 157 10*3/uL (ref 150–400)
Platelets: 188 10*3/uL (ref 150–400)
RBC: 3.47 MIL/uL — ABNORMAL LOW (ref 3.87–5.11)
RDW: 17.6 % — ABNORMAL HIGH (ref 11.5–15.5)
RDW: 17.9 % — ABNORMAL HIGH (ref 11.5–15.5)
RDW: 18.2 % — ABNORMAL HIGH (ref 11.5–15.5)
WBC: 5.1 10*3/uL (ref 4.0–10.5)
WBC: 7.4 10*3/uL (ref 4.0–10.5)

## 2010-04-13 LAB — H. PYLORI ANTIBODY, IGG: H Pylori IgG: 0.4 {ISR}

## 2010-04-13 LAB — URINE CULTURE
Colony Count: NO GROWTH
Culture: NO GROWTH

## 2010-04-13 LAB — URINALYSIS, ROUTINE W REFLEX MICROSCOPIC
Glucose, UA: NEGATIVE mg/dL
Leukocytes, UA: NEGATIVE
Protein, ur: NEGATIVE mg/dL
Urobilinogen, UA: 0.2 mg/dL (ref 0.0–1.0)

## 2010-04-13 LAB — COMPREHENSIVE METABOLIC PANEL
ALT: 39 U/L — ABNORMAL HIGH (ref 0–35)
Albumin: 2.4 g/dL — ABNORMAL LOW (ref 3.5–5.2)
Alkaline Phosphatase: 124 U/L — ABNORMAL HIGH (ref 39–117)
Calcium: 7.9 mg/dL — ABNORMAL LOW (ref 8.4–10.5)
GFR calc Af Amer: >60 mL/min (ref >60)
Potassium: 3.8 mEq/L (ref 3.5–5.1)
Sodium: 137 mEq/L (ref 135–145)
Total Protein: 5.5 g/dL — ABNORMAL LOW (ref 6.0–8.3)

## 2010-04-13 LAB — BASIC METABOLIC PANEL
BUN: 5 mg/dL — ABNORMAL LOW (ref 6–23)
BUN: 8 mg/dL (ref 6–23)
Chloride: 106 mEq/L (ref 96–112)
Chloride: 106 mEq/L (ref 96–112)
Creatinine, Ser: 0.79 mg/dL (ref 0.4–1.2)
Creatinine, Ser: 0.9 mg/dL (ref 0.4–1.2)
GFR calc Af Amer: >60 mL/min (ref >60)
Glucose, Bld: 101 mg/dL — ABNORMAL HIGH (ref 70–99)
Glucose, Bld: 122 mg/dL — ABNORMAL HIGH (ref 70–99)
Potassium: 3.5 mEq/L (ref 3.5–5.1)

## 2010-04-13 LAB — DIFFERENTIAL
Basophils Absolute: 0 10*3/uL (ref 0.0–0.1)
Basophils Absolute: 0 10*3/uL (ref 0.0–0.1)
Basophils Relative: 0 % (ref 0–1)
Basophils Relative: 0 % (ref 0–1)
Basophils Relative: 0 % (ref 0–1)
Eosinophils Absolute: 0 10*3/uL (ref 0.0–0.7)
Eosinophils Absolute: 0 10*3/uL (ref 0.0–0.7)
Eosinophils Relative: 0 % (ref 0–5)
Lymphs Abs: 0.7 10*3/uL (ref 0.7–4.0)
Lymphs Abs: 0.7 10*3/uL (ref 0.7–4.0)
Lymphs Abs: 1.7 10*3/uL (ref 0.7–4.0)
Monocytes Absolute: 0.3 10*3/uL (ref 0.1–1.0)
Monocytes Relative: 4 % (ref 3–12)
Neutrophils Relative %: 88 % — ABNORMAL HIGH (ref 43–77)

## 2010-04-13 LAB — STOOL CULTURE

## 2010-04-13 LAB — CLOSTRIDIUM DIFFICILE EIA
C difficile Toxins A+B, EIA: NEGATIVE
C difficile Toxins A+B, EIA: NEGATIVE
C difficile Toxins A+B, EIA: NEGATIVE

## 2010-04-13 LAB — URINE MICROSCOPIC-ADD ON

## 2010-04-13 LAB — LACTIC ACID, PLASMA: Lactic Acid, Venous: 1.7 mmol/L (ref 0.5–2.2)

## 2010-04-13 LAB — FECAL LACTOFERRIN, QUANT: Fecal Lactoferrin: NEGATIVE

## 2010-06-09 NOTE — Discharge Summary (Signed)
Melanie [name], Melanie [name]               ACCOUNT NO.:  [id number]   MEDICAL RECORD NO.:  [id number]          PATIENT TYPE:  OBV   LOCATION:  A209                          FACILITY:  [facility]   PHYSICIAN:  Edward L. Juanetta Gosling, M.D.DATE OF BIRTH:  [birth year]   DATE OF ADMISSION:  10/04/2006  DATE OF DISCHARGE:  09/10/2008LH                               DISCHARGE SUMMARY   FINAL DISCHARGE DIAGNOSES:  1. Syncope, with marked sinus bradycardia.  2. Rheumatoid arthritis.  3. Previous episodes of syncope, without definite diagnosis.  4. Chronic methotrexate use.   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  She  simply fell straight to the ground.  She suffered an injury to her  forehead and to her wrist.   Additional history is that she did not have any seizure-like activity.  She did not have any other neurological abnormalities associated with  this episode.   PHYSICAL EXAMINATION:  VITAL SIGNS:  Temperature was 98.7, pulse 60,  respirations 20, O2 saturation was 99% on room air.  GENERAL:  She was awake and alert, fully intact neurologically.  She did  not have any orthostatic blood pressure changes.  She did have a bandage  on her forehead because of the injury.   Her initial lab work, EKG, etc., in the emergency room were normal.   HOSPITAL COURSE:  She was admitted, monitored, and during the night that  she was admitted to the hospital her heart rate dropped into the 30s,  and she did have some symptoms associated with this.  It promptly  resolved.  It appeared to be a sinus bradycardia.  She had consultation  with Dr. Elsie Lincoln of the [facility] and, after he  evaluated her she was transferred to [facility] with the  possibility of having a pacemaker.  She is going to start by having  echocardiography and further evaluation as directed by Dr. Elsie Lincoln.      Edward L. Juanetta Gosling, M.D.  Electronically Signed     ELH/MEDQ  D:  10/06/2006  T:  10/07/2006  Job:  [id number]

## 2010-06-09 NOTE — Op Note (Signed)
NAMEMARQUELLE, BALOW NO.:  [id number]   MEDICAL RECORD NO.:  [id number]          PATIENT TYPE:  INP   LOCATION:  2031                         FACILITY:  [facility]   PHYSICIAN:  Darlin Priestly, MD  DATE OF BIRTH:  1945-03-12   DATE OF PROCEDURE:  10/06/2006  DATE OF DISCHARGE:                               OPERATIVE REPORT   PROCEDURE:  Insertion of a Medtronic Adapta ADDR generator, serial  [id number] with active atrial and ventricular leads.   COMPLICATIONS:  None.   INDICATION:  Ms. Baranowski is a 65 year old female, patient of Dr. Shaune Pollack, with a history of hyperlipidemia, history of recurrent syncope,  recently admitted with another syncopal episode and found to have  intermittent episodes of significant bradycardia, with the rates in the  30s.  She is now brought for dual chamber pacer implant for treatment of  recurrent syncope.   DESCRIPTION OF PROCEDURE:  After obtaining informed consent, the patient  was brought into the cardiac catheterization laboratory where her left  chest was prepped and draped in the sterile fashion.  ECG monitoring was  established.  Lidocaine 1% was then used to anesthetize the left mid  subclavicular line.  Next, approximately a 3 cm horizontal mid  infraclavicular incision was carried out and hemostasis was obtained  with electrocautery.  Blunt dissection was used to carry this down to  the left pectoral fascia. Next, approximately a 3 x 4-cm pocket was then  created over the left pectoral fascia.  Again, hemostasis was obtained  with electrocautery.  The left subclavian vein was then easily entered  with 2 retained guide wires.  A 4-0 silk suture was then placed at the  base of the retained guide wires into the pectoral fascia.  Over the  first retained guide wire, a 7-French dilator and sheath were then  easily passed into the left subclavian vein and the dilator and guide  wire were removed.  Through this, a 52 cm  active Medtronic lead model  #5076, serial [id number] was then passed into the right atrium, peel-  away sheath was removed.  Over the second retained guide wire, a second  7-French dilator and sheath were then passed into the left subclavian  vein and the dilator and guide wire were removed.  Through this, a 45 cm  active Medtronic lead model #5076, serial [id number] was then passed  into the right atrium and the peel-away sheath was removed.  A J shape  was then placed in the ventricular lead stylet and the ventricular lead  was allowed to prolapse to the tricuspid valve and then positioned in  the RV apex without difficulty.  We were able to capture 2 volts, and  the screw was then extended and thresholds were then determined.  R  waves were measured at 10.2 millivolts.  Impedance at 1,090 Ohms.  Threshold is 0.5 volts at 0.5 milliseconds.  Current is 0.9 milliamps.  Two volts negative for diaphragmatic stimulation.  The atrial stylet was  then removed and a preformed atrial J stylet was inserted in the atrial  lead  and the atrial lead was then positioned in the right atrial  appendage.  We were able to capture 2 volts and the screw was then  extended and thresholds were then determined.  P waves were measured at  2.6 millivolts.  Impedance 1,140 Ohms.  Threshold in the atrial lead is  1.1 volts at 0.5 milliseconds.  Current is 1 milliamp.  Ten volts  negative for diaphragmatic stimulation.  The leads were then secured in  place with 2-0 silk sutures per lead to anchor these to the pectoral  fascia.  The pocket was then copiously irrigated with 1% kanamycin  solution.  Again, hemostasis was confirmed.  The leads were then  connected in a serial fashion to a Medtronic Adapta ADDRO1 generator,  serial [id number].  Head screws were tightened and pacing was  confirmed.  A single silk suture was then placed in the superior apex of  the pocket.  The generator and leads were then delivered  into the  pocket.  The header was secured to the silk suture.  Subcutaneous layers  were then closed with running 2-0 Vicryl.  The skin is then closed using  4-0 Vicryl.  Steri-Strips were then applied.  The patient was returned  to the recovery room in stable condition.   CONCLUSIONS:  Successful implant of a Medtronic Adapta ADDRO1 generator,  serial [id number] with active atrial and ventricular leads.      Darlin Priestly, MD  Electronically Signed     RHM/MEDQ  D:  10/06/2006  T:  10/06/2006  Job:  [id number]   cc:   Ramon Dredge L. Juanetta Gosling, M.D.  Madaline Savage, M.D.

## 2010-06-09 NOTE — Discharge Summary (Signed)
Melanie [name], ENGELBRECHT NO.:  [id number]   MEDICAL RECORD NO.:  [id number]          PATIENT TYPE:  INP   LOCATION:  2031                         FACILITY:  [facility]   PHYSICIAN:  Darlin Priestly, MD  DATE OF BIRTH:  07/25/1945   DATE OF ADMISSION:  10/05/2006  DATE OF DISCHARGE:                               DISCHARGE SUMMARY   DISCHARGE DIAGNOSES:  1. Syncopal episode secondary to symptomatic bradycardia.  2. 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  3. Hyperlipidemia.  She had been taking Lipitor but stopped about 2      weeks ago because it made her feel bad.  4. Rheumatoid arthritis- treated.   This is a 65 year old female who was admitted to [facility] on  October 04, 2006 after she had 3 episodes of syncope.  The last episode  was on the day of her presentation, without any real prodromal symptoms.  She was talking to her neighbors, and then simply fell down.  She  bruised her nose, lacerated her head, and had some abrasions on the  right thumb and both knees.  The neighbors said that she did not even  put her arms out, and simply went straight down.  The patient had 2  previous episodes of syncope before.  She was seen by Dr. Pearlean Brownie,  neurologist, and she had full workup, but no definitive diagnosis was  made.  This time, the patient was admitted to the telemetry unit and  placed on a monitor.  Cardiology consultation was ordered, Dr. Elsie Lincoln  saw the patient and suspected the syncope could be secondary to cardiac  causes, and her heart rate went down as low as in the 30s with some  symptoms of dizziness and nausea.   Based on these findings, the patient was scheduled for implantation of  permanent transvenous pacemaker.   HOSPITAL PROCEDURE:  Dual-chamber pacemaker insertion was performed by  Dr. Jenne Campus on October 06, 2006.  He inserted Medtronic device,  Adaptor generator serial number [id number],  atrial lead serial number  [id number], and ventricular lead serial number [id number].   The patient tolerated the procedure well.  The next morning the device  was interrogated by pacer representative, and no adjustments were made.  Normal functioning was found.  The patient underwent chest x-ray, the  results are still pending.  Given negative results, the patient will be  discharged home.   While in the hospital, she underwent 2D echocardiogram.  It was done at  [facility], and the result is not entered in the computer  system, and I do not have any type result available to me right now.   HOSPITAL LABORATORY DATA:  TSH was mildly elevated at 5.681.  This will  need to be followed by primary care physician.  CBC revealed white blood  cell count 6.6, hemoglobin 12.5, hematocrit 36.7, and platelet count  142,000.  BMP showed sodium of 142, potassium 3.6, chloride 110, CO2 27,  glucose 75, BUN 9, creatinine 0.75.  Random cortisol was 6.8, pro time  15.0,  INR 1.2.  Cardiac panel was negative.   DISCHARGE MEDICATIONS:  1. Folic acid 1 mg daily.  2. Methotrexate weekly, the same dose.  3. Aspirin 81 mg daily, start tomorrow.   DISCHARGE INSTRUCTIONS:  The patient was instructed not to wet the  incision site up until seen in the office.  She will increase activity  slowly, avoid lifting overhead, reaching, vigorous movements and  driving, and vigorous movements with left arm and also avoid driving.   Our office will contact the patient to schedule pacer site checkup.      Raymon Mutton, P.A.      Darlin Priestly, MD  Electronically Signed    MK/MEDQ  D:  10/07/2006  T:  10/07/2006  Job:  [id number]   cc:   Ramon Dredge L. Juanetta Gosling, M.D.  [facility] [place]  [facility], [place]

## 2010-06-09 NOTE — Consult Note (Signed)
Melanie [name]               ACCOUNT NO.:  [id number]   MEDICAL RECORD NO.:  [id number]          PATIENT TYPE:  OBV   LOCATION:  A209                          FACILITY:  [facility]   PHYSICIAN:  Kofi A. Gerilyn Pilgrim, M.D. DATE OF BIRTH:  09/21/1945   DATE OF CONSULTATION:  10/05/2006  DATE OF DISCHARGE:  10/05/2006                                 CONSULTATION   REASON FOR CONSULTATION:  Seizure.   HISTORY OF PRESENT ILLNESS:  This is a 65 year old white female who had  3 spells of syncope and presyncopal episodes. The symptomatology seems  all different in each 3 events. She does not report having any real  prodromal symptoms other than probably the most recent one. She did  report having some nausea in the first event a few weeks ago, but the  other 2 spells have not been associated with nausea. The most recent  event happened on the day before admission when she developed somewhat  of an unusual sensation involving the right occipital region and she  simply fell to the ground and passed out hitting the front of the head.  She was probably out for about 15 seconds. She denies any focal  numbness, weakness, chest pain, shortness of breath, tonic or clonic  activity. She also denies any urinary or bowel incontinence. The other 2  previous spells were more presyncopal episode with the patient slumping  to the ground and not entirely losing consciousness.   She was worked up for other events by her PCP and also it appears that  she saw Dr. Drue Second the vascular neurologist in [place]. The patient  indicated that she may have had some Doppler studies and MRI although I  do not see those results in the computer. She also reported that she may  have had MRI of the brain done in April of this year, it was  unremarkable. CT scan done on admission this time around is also  unremarkable. There is some generalized atrophy but otherwise  unrevealing.   PAST MEDICAL HISTORY:  1. Rheumatoid  arthritis.  2. Hyperlipidemia.  3. Syncopal episodes.   PAST SURGICAL HISTORY:  1. Lumpectomy.  2. Appendectomy.   ADMISSION MEDICATIONS:  1. Methotrexate.  2. Folic acid.  3. Aspirin.  4. Lipitor, but she discontinued this 2 weeks ago because she just did      not feel right.   SOCIAL HISTORY:  She is a retired Engineer, site. No history of tobacco,  alcohol or illicit drug use.   REVIEW OF SYSTEMS:  Stated in history of present illness, otherwise  unrevealing. Again, she really does not report any lightheadedness or  dizziness before these events.   PHYSICAL EXAMINATION:  VITAL SIGNS: Blood pressure 142/71, pulse 60,  respirations 18, temperature 96.9.  HEENT: On evaluation she has a bruise on the forehead which is bandaged.  NECK: The neck is supple.  ABDOMEN: The abdomen is soft.  EXTREMITIES: No significant edema.  MENTATION: She is awake and alert. She converses well. Speech, language  and cognition are intact.  NEUROLOGICAL: Cranial nerve evaluation, pupils are equally  round and  reactive to light and accommodation. Extra-ocular movements are full. No  nystagmus is noted. Visual fields are intact. Facial movements are  symmetric. The tongue is midline, uvula is midline. Shoulder shrugs are  normal. Motor examination shows normal tone, bulk and strength.  Coordination shows no dysmetria, tremors or past-pointing. Reflexes are  preserved with plantar reflexes going down. Sensation is normal to  temperature, light touch.   LABORATORY DATA:  Urinalysis, nitrites negative, leukocyte esterase  small.   CPK 91. Cortisol level random 10. Sodium 137, potassium 3, chloride 105,  CO2 25, glucose 127, BUN 12, creatinine 0.8, calcium 8.5.   ASSESSMENT:  Recurrent syncope with no stereotyped phenomenon suggestive  of seizures. The etiology is unlikely to be neurologic in nature. I  doubt primary ischemia although carotid disease bilaterally could be an  issue.    RECOMMENDATIONS:  1. She has a cardiology consult that is pending.  2. She actually did have orthostatics and they are negative.  3. EEG.  4. Carotid Dopplers.  5. Antiplatelet agents.   Thanks for this consultation.      Kofi A. Gerilyn Pilgrim, M.D.  Electronically Signed     KAD/MEDQ  D:  10/05/2006  T:  10/05/2006  Job:  [id number]

## 2010-06-09 NOTE — H&P (Signed)
Melanie [name], Melanie [name]               ACCOUNT NO.:  [id number]   MEDICAL RECORD NO.:  [id number]          PATIENT TYPE:  OBV   LOCATION:  A209                          FACILITY:  [facility]   PHYSICIAN:  Edward L. Juanetta Gosling, M.D.DATE OF BIRTH:  06/22/1945   DATE OF ADMISSION:  10/04/2006  DATE OF DISCHARGE:  LH                              HISTORY & PHYSICAL   REASON FOR ADMISSION:  Syncope.   HISTORY OF PRESENT ILLNESS:  Ms. Gunnells is a 65 year old who has now had  three episodes of syncope.  The last today was without any real  prodromal symptoms.  She says that she was talking to neighbors and she  simply fell down.  She has a bruised nose, a laceration on her head,  abrasions to her right thumb and to both knees.  She was talking with a  neighbor and they said that she did not even put her arms out, they said  she simply went straight down.  She she has had two previous episodes.  She is actually seen Dr. Pearlean Brownie, the neurologist, about this, and he has  had her worked up, and no definitive diagnosis was made.   PAST MEDICAL HISTORY:  1. Syncopal episodes.  2. Rheumatoid arthritis.  3. Hyperlipidemia.  She had been taking Lipitor, but stopped it about      2 weeks ago because she felt bad with it.   SURGICAL HISTORY:  She has had an appendectomy, lumpectomy.   SOCIAL HISTORY:  She is retired Engineer, site.  She does not smoke.  She does not drink any alcohol.  She does not use any illicit drugs.   FAMILY HISTORY:  Positive for cardiac disease in her father,  hyperlipidemia in multiple family members.   MEDICATIONS:  1. Aspirin 325 mg daily.  2. Folic acid 1 mg daily.  3. Methotrexate once a week.  I am not sure of that dose.   REVIEW OF SYSTEMS:  Other than as mentioned, she has not noticed any  other problems.  She has not been dizzy.  She has had no changes in her  mental status, otherwise.   PHYSICAL EXAMINATION:  GENERAL:  A well-developed, well-nourished female  who is in  no acute distress.  VITAL SIGNS:  Temperature 96.9, pulse 63, respirations are 18, blood  pressure 142/71, O2 sats 98%.  She has said no orthostatic changes.  HEENT:  She has a laceration on her face, laceration on her nose.  Nose  and throat are clear.  Her mucous membranes are moist.  CHEST:  Her chest is clear.  HEART:  Regular.  ABDOMEN: Soft.  EXTREMITIES:  Showed no edema.  CENTRAL NERVOUS SYSTEM: Examination is grossly intact.  Tympanic  membranes are intact.  She does not have any bruits.  Her heart is  regular with normal rate she does not have a gallop.  She does not have  a murmur.   LABORATORY WORK:  B-met is normal.  CBC shows a normal white count 6900,  hemoglobin is 113.3.  She had a CT of the brain which was negative.  ASSESSMENT:  She has syncope.  It is not at all clear what is happening  to her.  This last episode sounds like it may well be a cardiac problem,  but it is not totally clear.  I am going to have her get a cardiac  panel, get a cardiology consultation.  She is going to have  echocardiogram.  I am going to have her get a cortisol level and then  follow.      Edward L. Juanetta Gosling, M.D.  Electronically Signed     ELH/MEDQ  D:  10/04/2006  T:  10/05/2006  Job:  [id number]

## 2010-06-09 NOTE — Consult Note (Signed)
Melanie [name], Melanie [name]               ACCOUNT NO.:  [id number]   MEDICAL RECORD NO.:  [id number]          PATIENT TYPE:  OBV   LOCATION:  A209                          FACILITY:  [facility]   PHYSICIAN:  Melanie [name], Melanie AshleyDATE OF BIRTH:  [birth year]   DATE OF CONSULTATION:  10/05/2006  DATE OF DISCHARGE:  10/05/2006                                 CONSULTATION   CLINICAL PROBLEM:  Syncopal episode with marked sinus bradycardia.   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  This patient was admitted for an  episode of syncope.   HISTORY:  The patient reports that she had an episode that lasted  approximately 15 minutes with syncope.  She felt a full sensation up  in  her throat and then felt faint and dropped right into the floor,  suffering an abrasion to both her forehead and her left wrist.  The  first episode of syncope occurred in February of this year that was  similar, but not as severe.  A second episode has occurred sometime in  the fall or early summer and the episode prompting hospitalization  presently occurred as described earlier.  The patient is now at bed rest  and has a rhythm of sinus rhythm and episodes of sinus bradycardia and  is fully alert, coherent and able to give a clear-cut history.   Pertinent to the above history, the patient has seen Melanie [name],  neurologist, in the past for evaluation of a syncopal episode and  apparently transcranial Dopplers and carotid Dopplers were performed  and, although those results are not present and available for review,  the patient, who is a reliable historian, reports that these tests were  not remarkable.   The patient's other main medical problem is rheumatoid arthritis.  She  sees Melanie [name] for that and she is on methotrexate for it.   FAMILY HISTORY:  Both father and brother died  with cancer.   SOCIAL HISTORY:  The patient has a sister, who is present at the current  time, here at [facility].  For other social history,  please see patient's history and physical, dictated by Melanie [name].  No drug abuse, nondrinker, nonsmoker.   REVIEW OF SYSTEMS:  The patient has been taking Lipitor prophylactically  for high cholesterol, but stopped it because of apparent medication side  effects.   PHYSICAL EXAMINATION:  Patient's temperature was 98.7, pulse was 60,  respirations 20, O2 saturations 99% on room air.  This is an intelligent, good historian, who appears her stated age of 65  years.  There is a bandage over her forehead.  EARS, EYES, NOSE AND THROAT:  Are not remarkable.  NECK:  Reveals no carotid bruits, thyromegaly, adenopathy, or JVD.  LUNGS:  Clear, both anteriorly and posteriorly.  No rales, wheezes,  rhonchi or consolidation noted.  CARDIAC EXAM:  Suboptimal exam, but no murmurs heard, no gallops, no  rubs.  ABDOMEN:  Soft without organomegaly.  EXTREMITIES:  The patient has typical ulnar deviation of rheumatoid  arthritis, but it is mild.   EKG shows sinus rhythm at a rate of 60 a minute.  It is basically a  normal tracing.  QT interval normal.  There is a rhythm strip, performed  on October 04, 2006, at 2018 hours, showing sinus bradycardia at a rate  of 30 a minute and a second strip, done about 33 seconds later, showing  a heart rate of 36.   FINAL IMPRESSIONS:  Recurrent syncopal episodes with forehead and wrist  injury.   PLAN:  The patient will need to be transferred to [facility] to  a telemetry monitor.  I have spoken to Melanie [name], my associate,  and we have tentatively scheduled patient for a permanent pacemaker  tomorrow, October 06, 2006, at [facility].  We will transfer  her there today by [facility] and get an echocardiogram later this  afternoon.            ______________________________  Melanie [name], M.D.     WHG/MEDQ  D:  10/05/2006  T:  10/05/2006  Job:  [id number]   cc:   Billee Cashing, MD   Oneal Deputy Juanetta Gosling, M.D.  Fax: [phone]   Medical Records [facility]

## 2010-06-09 NOTE — Procedures (Signed)
Melanie [name], Melanie [name]               ACCOUNT NO.:  [id number]   MEDICAL RECORD NO.:  [id number]          PATIENT TYPE:  INP   LOCATION:  A209                          FACILITY:  [facility]   PHYSICIAN:  Dani Gobble, MD       DATE OF BIRTH:  November 04, 1945   DATE OF PROCEDURE:  10/05/2006  DATE OF DISCHARGE:  10/05/2006                                ECHOCARDIOGRAM   REFERRING PHYSICIANS:  1. Edward L. Juanetta Gosling, M.D.  2. Madaline Savage, M.D.   INDICATIONS:  A 65 year old female with a history of syncope.   Technical quality of this study is adequate.   The aorta measures normally at 2.7 cm.   Left atrium measures normally at 3.9 cm.  The patient appeared to be in  sinus rhythm during this procedure.   The interventricular septum and posterior wall are both within normal  limits, measured at 0.90 cm for each.   The aortic valve is trileaflet and pliable with normal opening.  No  aortic insufficiency is noted.  Doppler interrogation of the aortic  valve is within normal limits.  The right coronary cusp is not minimally  thickened.   The mitral valve also appears structurally normal.  No mitral valve  prolapse is noted.  Trivial mitral regurgitation is noted.  Doppler  interrogation of mitral valve is within normal limits.   Pulmonic valve appears grossly structurally normal revealed pulmonic  insufficiency.   Tricuspid valve is structurally normal with trivial tricuspid  regurgitation noted.   The left ventricle is normal in size with LVIDD measured at 4.1 cm and  LVIC measured at 3 cm.  Overall left systolic function is normal and no  regional wall motion abnormalities are noted.   The right atrium is normal in size while the right ventricle is mildly  dilated but with normal right ventricular systolic function.   IMPRESSION:  1. Minimal aortic sclerosis without stenosis.  2. Trivial mitral and tricuspid regurgitation.  3. Normal left ventricular size and systolic function  without regional      wall motion abnormality noted.  4. Mild right ventricular enlargement with normal right ventricular      systolic function.           ______________________________  Dani Gobble, MD     AB/MEDQ  D:  10/13/2006  T:  10/13/2006  Job:  [id number]   cc:   Ramon Dredge L. Juanetta Gosling, M.D.  Fax: [phone]   Madaline Savage, M.D.  Fax: [phone]

## 2010-06-09 NOTE — Group Therapy Note (Signed)
Melanie [name], Melanie [name]               ACCOUNT NO.:  [id number]   MEDICAL RECORD NO.:  [id number]          PATIENT TYPE:  OBV   LOCATION:  A209                          FACILITY:  [facility]   PHYSICIAN:  Edward L. Juanetta Gosling, M.D.DATE OF BIRTH:  Jul 21, 1945   DATE OF PROCEDURE:  DATE OF DISCHARGE:  10/05/2006                                 PROGRESS NOTE   Ms. Fischler is overall about the same.  She did have an episode of  feeling like she was going to pass out, although she did not lose  consciousness, and during that time apparently her heart rate was in the  30s.  I have not been able to find the documentation of that as yet.  At  any rate, she has ordered a consultation with [facility].  I now see at 2018, October 04, 2006, heart rate in the 38 range.  This  is a sinus bradycardia.  I will see what the cardiologist thinks about  all this.  Otherwise she is doing pretty well.   Her temperature is 97.7, pulse 52, respirations 16, blood pressure  118/63, O2 sats 98%.  She does not have any orthostatic changes of her  vitals.   ASSESSMENT:  She has got bradycardia and she has had another episode of  being faint, although she did not pass out.  The concern is whether she  has significant bradycardia that may cause her to have syncope.  She is  scheduled for an echocardiogram and for cardiology consultation.      Edward L. Juanetta Gosling, M.D.  Electronically Signed     ELH/MEDQ  D:  10/05/2006  T:  10/05/2006  Job:  [id number]

## 2010-06-16 IMAGING — CT CT ABD-PELV W/ CM
2 of 5 series · 16 of 46 positions shown, 18 images · IV contrast (Omnipaque 300)
Comparison: CT scan 08/14/2006

CLINICAL DATA: Confusion.  Fever.  Weight loss and diarrhea.

CT ABDOMEN AND PELVIS WITH CONTRAST
TECHNIQUE: Multidetector CT imaging of the abdomen and pelvis was
performed following the standard protocol during bolus
administration of intravenous contrast.
Contrast: 100 ml 5mnipaque-J11

[Series 2: abd_pel_with 5.0 b40f · axial · 0.69mm/px · z∈[-586,-226]mm · 13 of 82 slices shown, 15 images]
[im 5/82  soft-tissue]
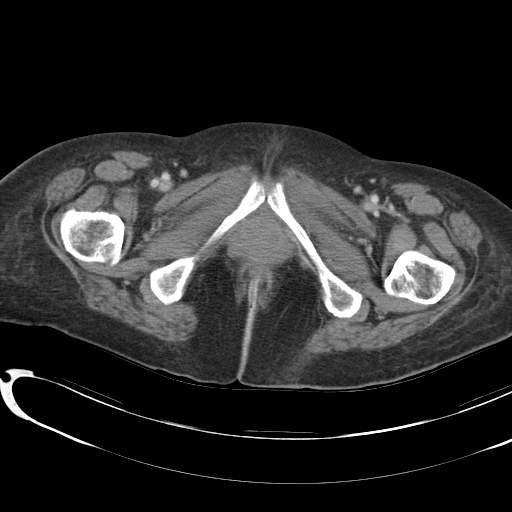
[im 5/82  bone]
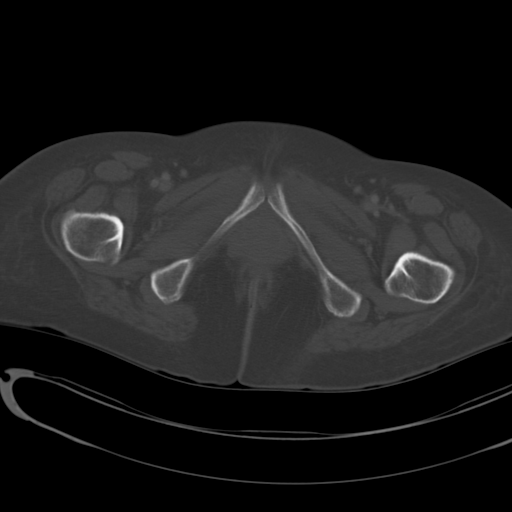
[im 10/82  soft-tissue]
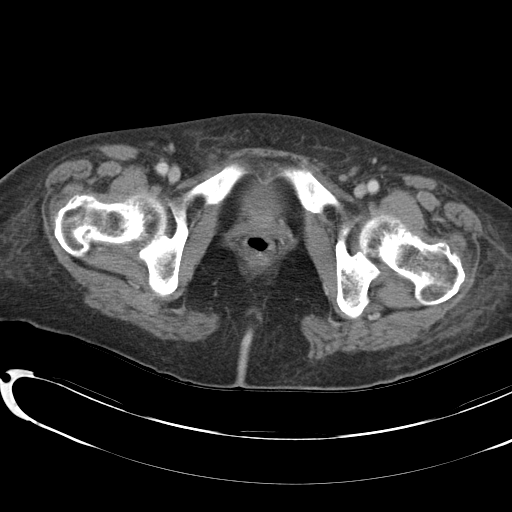
[im 19/82  soft-tissue]
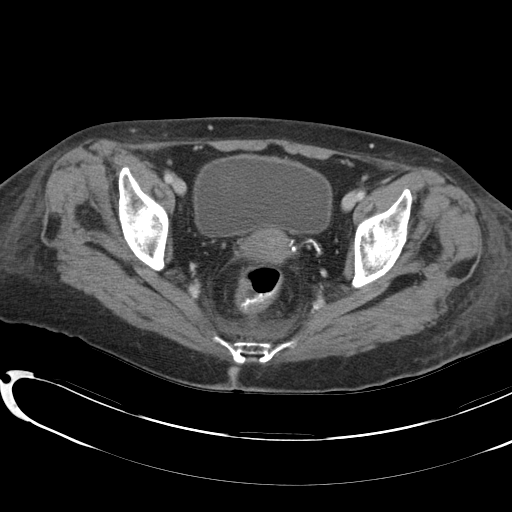
[im 23/82  soft-tissue]
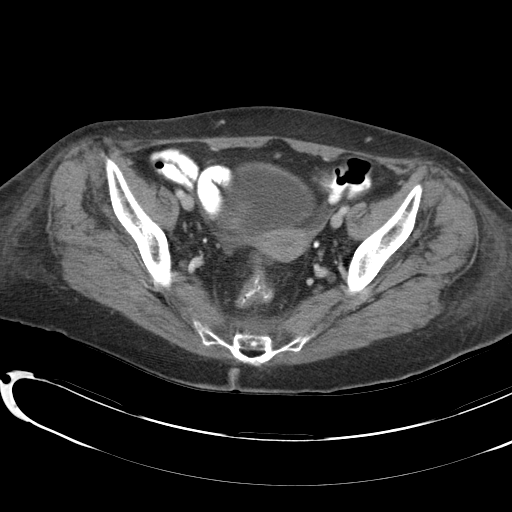
[im 28/82  soft-tissue]
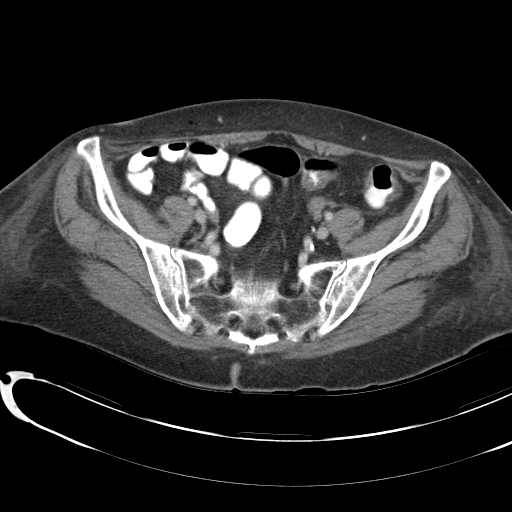
[im 37/82  soft-tissue]
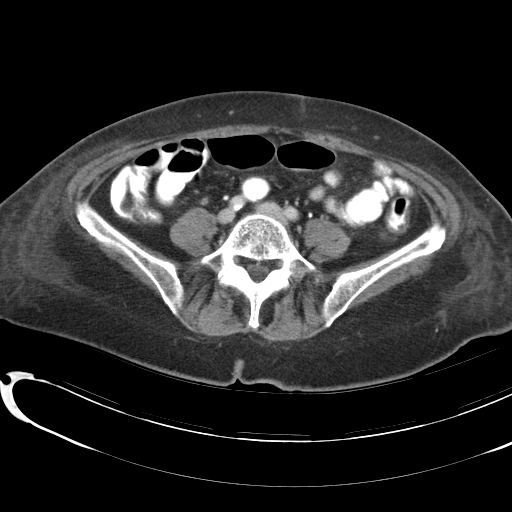
[im 41/82  soft-tissue]
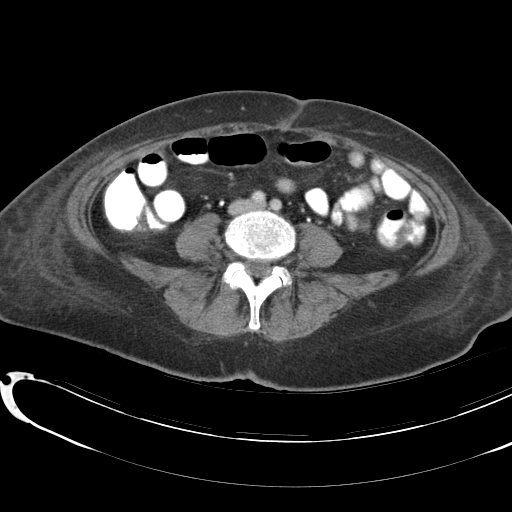
[im 46/82  soft-tissue]
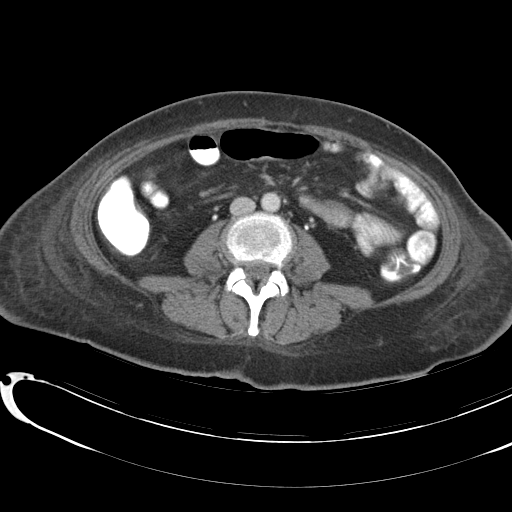
[im 55/82  soft-tissue]
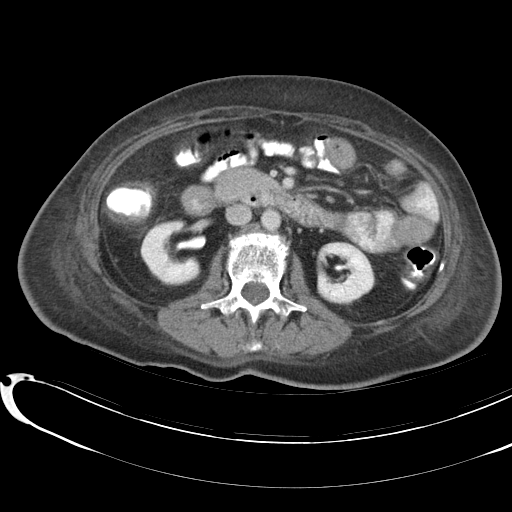
[im 55/82  bone]
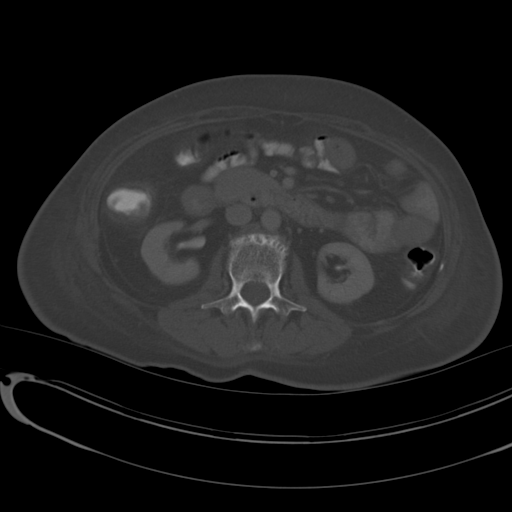
[im 59/82  soft-tissue]
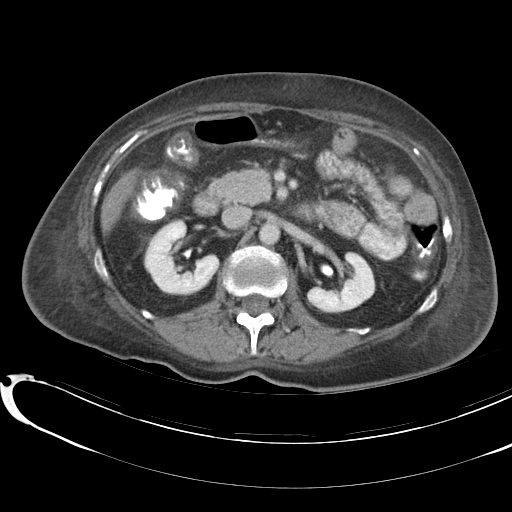
[im 64/82  soft-tissue]
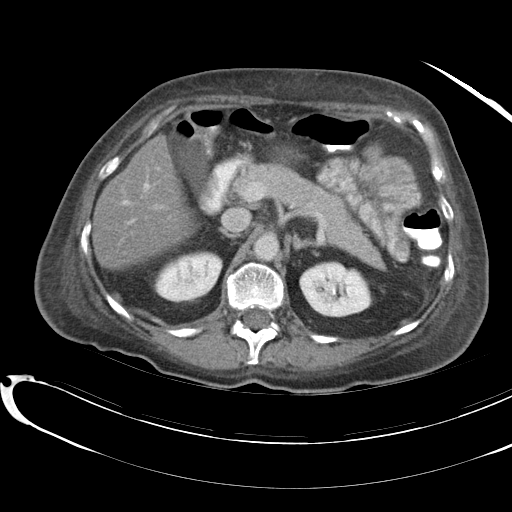
[im 73/82  soft-tissue]
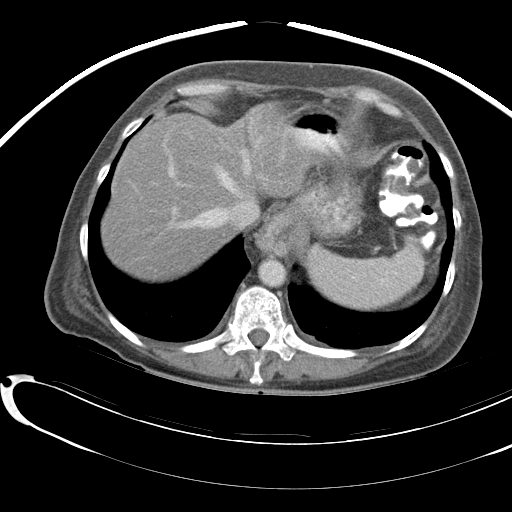
[im 77/82  soft-tissue]
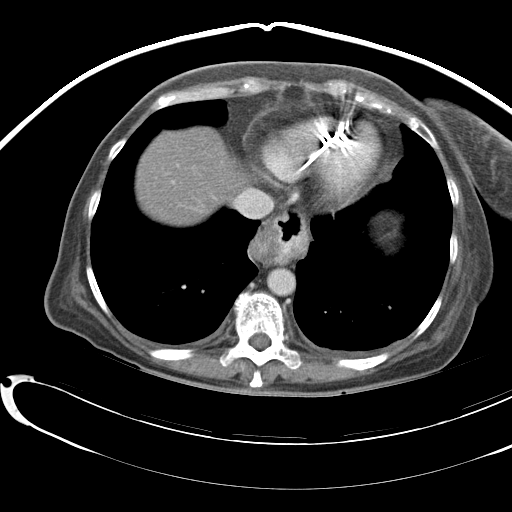

[Series 4: mpr cor post contrast (id) · coronal · 0.66mm/px · 3 of 71 slices shown]
[im 24/71  soft-tissue]
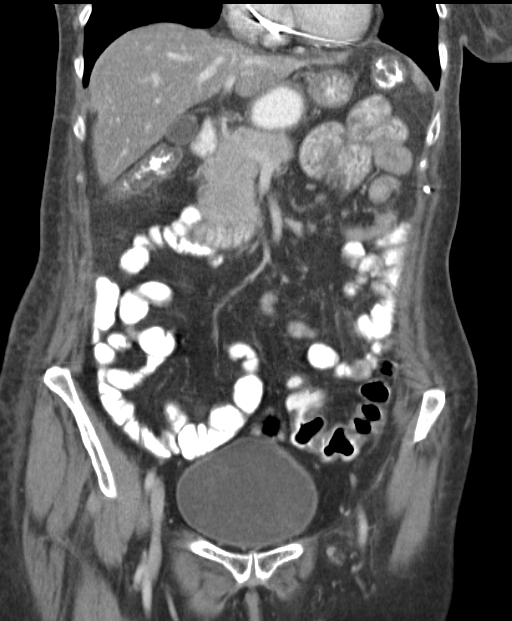
[im 32/71  soft-tissue]
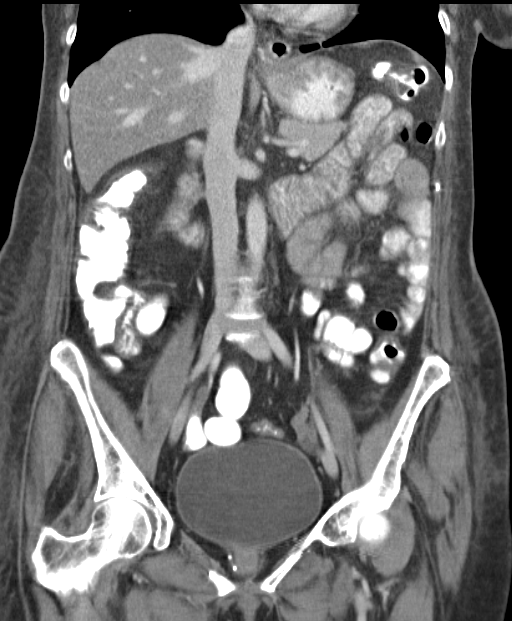
[im 39/71  soft-tissue]
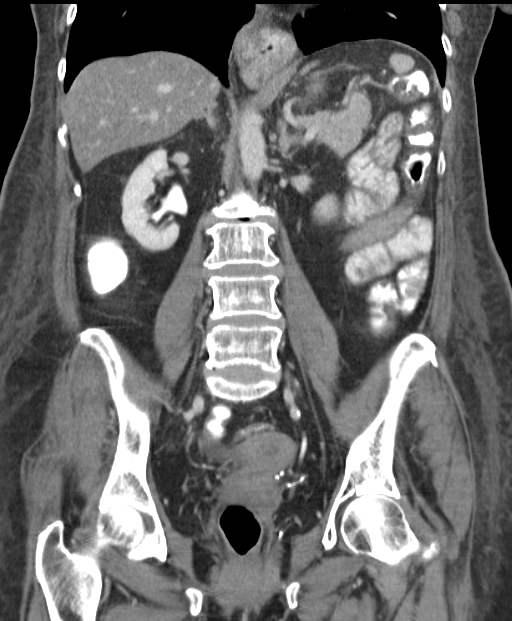

[16 of 46 positions shown; findings below may reference images not displayed]

FINDINGS: The lung bases are clear except for a small left effusion
and minimal overlying atelectasis.  There is a large stable hiatal
hernia.

There is diffuse fatty infiltration of the liver but no focal
hepatic lesions or biliary dilatation.  Gallbladder appears normal.
The pancreas, spleen, adrenal glands and kidneys are unremarkable
and stable.

The stomach, duodenum and small bowel are unremarkable.  There are
patchy areas of colonic inflammation involving portions of the
ascending, transverse and descending colon.  Some involvement of
the rectosigmoid area is also noted.  This could reflect
inflammatory bowel disease or an infectious process.

The aorta is normal in caliber.  The major branch vessels are
normal.  No mesenteric or retroperitoneal masses or adenopathy.

The uterus and ovaries are unremarkable.  The bladder appears
normal.  No pelvic mass or adenopathy.  No inguinal mass or hernia.

The bony structures are unremarkable.
IMPRESSION: 1.  Segmental areas of inflammation involving the colon may reflect
inflammatory bowel disease or infectious process.
2.  Small left effusion and minimal overlying atelectasis.
3.  Large hiatal hernia.
4.  Diffuse fatty infiltration of the liver.
5.  No mass lesions or adenopathy.

## 2010-11-06 LAB — BASIC METABOLIC PANEL
BUN: 9
CO2: 26
Calcium: 8.5
Calcium: 8.6
Chloride: 105
GFR calc Af Amer: >60
GFR calc non Af Amer: >60
Glucose, Bld: 127 — ABNORMAL HIGH
Glucose, Bld: 75
Sodium: 137
Sodium: 142

## 2010-11-06 LAB — CBC
HCT: 39.6
Hemoglobin: 13.3
Hemoglobin: 12.5
MCHC: 33.6
MCV: 97.8
Platelets: 142 — ABNORMAL LOW
RBC: 4.05
RDW: 14.5 — ABNORMAL HIGH
RDW: 14.9 — ABNORMAL HIGH
WBC: 6.6

## 2010-11-06 LAB — DIFFERENTIAL
Basophils Absolute: 0
Basophils Relative: 1
Eosinophils Absolute: 0.1
Eosinophils Relative: 2
Monocytes Absolute: 0.5
Monocytes Relative: 8

## 2010-11-06 LAB — URINALYSIS, ROUTINE W REFLEX MICROSCOPIC
Bilirubin Urine: NEGATIVE
Glucose, UA: NEGATIVE
Nitrite: NEGATIVE
Specific Gravity, Urine: 1.002 — ABNORMAL LOW
pH: 7

## 2010-11-06 LAB — CORTISOL: Cortisol, Plasma: 6.8

## 2010-11-06 LAB — PROTIME-INR: INR: 1.2

## 2010-11-06 LAB — CARDIAC PANEL(CRET KIN+CKTOT+MB+TROPI)
CK, MB: 2.3
CK, MB: 2.8
Relative Index: INVALID
Total CK: 113
Total CK: 89
Troponin I: 0.01
Troponin I: 0.01

## 2010-11-06 LAB — TSH: TSH: 5.681 — ABNORMAL HIGH

## 2010-11-06 LAB — URINE MICROSCOPIC-ADD ON

## 2010-11-09 LAB — BASIC METABOLIC PANEL
BUN: 6
Calcium: 9
Creatinine, Ser: 0.94
GFR calc non Af Amer: >60
Glucose, Bld: 114 — ABNORMAL HIGH
Potassium: 3.8

## 2010-11-09 LAB — URINALYSIS, ROUTINE W REFLEX MICROSCOPIC
Bilirubin Urine: NEGATIVE
Glucose, UA: NEGATIVE
Ketones, ur: NEGATIVE
Leukocytes, UA: NEGATIVE
Nitrite: NEGATIVE
Protein, ur: NEGATIVE
pH: 6

## 2010-11-09 LAB — POCT CARDIAC MARKERS
CKMB, poc: 1.1
Troponin i, poc: <0.05

## 2010-11-09 LAB — CBC
HCT: 43.4
Platelets: 160
RDW: 14.7 — ABNORMAL HIGH
WBC: 10.9 — ABNORMAL HIGH

## 2010-11-09 LAB — DIFFERENTIAL
Basophils Absolute: 0
Eosinophils Relative: 1
Lymphocytes Relative: 16
Neutrophils Relative %: 77

## 2010-11-09 LAB — URINE MICROSCOPIC-ADD ON

## 2010-11-11 ENCOUNTER — Encounter: Payer: Self-pay | Admitting: Internal Medicine

## 2011-01-18 ENCOUNTER — Emergency Department (HOSPITAL_COMMUNITY): Payer: BC Managed Care – PPO

## 2011-01-18 ENCOUNTER — Inpatient Hospital Stay (HOSPITAL_COMMUNITY)
Admission: EM | Admit: 2011-01-18 | Discharge: 2011-01-20 | DRG: 767 | Disposition: A | Discharge status: Home / Self Care | Payer: BC Managed Care – PPO | Attending: General Surgery | Admitting: General Surgery

## 2011-01-18 DIAGNOSIS — S020XXA Fracture of vault of skull, initial encounter for closed fracture: Secondary | ICD-10-CM

## 2011-01-18 DIAGNOSIS — M069 Rheumatoid arthritis, unspecified: Secondary | ICD-10-CM | POA: Diagnosis present

## 2011-01-18 DIAGNOSIS — R4182 Altered mental status, unspecified: Secondary | ICD-10-CM

## 2011-01-18 DIAGNOSIS — IMO0002 Reserved for concepts with insufficient information to code with codable children: Secondary | ICD-10-CM

## 2011-01-18 DIAGNOSIS — S5010XA Contusion of unspecified forearm, initial encounter: Secondary | ICD-10-CM | POA: Diagnosis present

## 2011-01-18 DIAGNOSIS — S0291XA Unspecified fracture of skull, initial encounter for closed fracture: Secondary | ICD-10-CM | POA: Diagnosis present

## 2011-01-18 DIAGNOSIS — S0101XA Laceration without foreign body of scalp, initial encounter: Secondary | ICD-10-CM

## 2011-01-18 DIAGNOSIS — Y9229 Other specified public building as the place of occurrence of the external cause: Secondary | ICD-10-CM

## 2011-01-18 DIAGNOSIS — R51 Headache: Secondary | ICD-10-CM

## 2011-01-18 DIAGNOSIS — S060X9A Concussion with loss of consciousness of unspecified duration, initial encounter: Secondary | ICD-10-CM | POA: Diagnosis present

## 2011-01-18 DIAGNOSIS — Z950 Presence of cardiac pacemaker: Secondary | ICD-10-CM

## 2011-01-18 DIAGNOSIS — S0990XA Unspecified injury of head, initial encounter: Secondary | ICD-10-CM

## 2011-01-18 DIAGNOSIS — S02109A Fracture of base of skull, unspecified side, initial encounter for closed fracture: Principal | ICD-10-CM | POA: Diagnosis present

## 2011-01-18 LAB — BASIC METABOLIC PANEL
BUN: 12 mg/dL (ref 6–23)
CO2: 22 mEq/L (ref 19–32)
Chloride: 103 mEq/L (ref 96–112)
GFR calc Af Amer: 80 mL/min — ABNORMAL LOW (ref >90)
Glucose, Bld: 117 mg/dL — ABNORMAL HIGH (ref 70–99)
Potassium: 3.3 mEq/L — ABNORMAL LOW (ref 3.5–5.1)

## 2011-01-18 LAB — DIFFERENTIAL
Lymphs Abs: 2.5 10*3/uL (ref 0.7–4.0)
Monocytes Relative: 3 % (ref 3–12)
Neutro Abs: 7.4 10*3/uL (ref 1.7–7.7)
Neutrophils Relative %: 72 % (ref 43–77)

## 2011-01-18 LAB — CBC
Hemoglobin: 13.1 g/dL (ref 12.0–15.0)
RBC: 3.98 MIL/uL (ref 3.87–5.11)

## 2011-01-18 MED ORDER — ONDANSETRON HCL 4 MG/2ML IJ SOLN
4.0000 mg | Freq: Once | INTRAMUSCULAR | Status: AC
  Administered 2011-01-18: 4 mg via INTRAVENOUS

## 2011-01-18 MED ORDER — CEFAZOLIN SODIUM 1-5 GM-% IV SOLN
1.0000 g | Freq: Three times a day (TID) | INTRAVENOUS | Status: DC
  Administered 2011-01-18 – 2011-01-20 (×6): 1 g via INTRAVENOUS
  Filled 2011-01-18 (×9): qty 50

## 2011-01-18 MED ORDER — BACITRACIN ZINC 500 UNIT/GM EX OINT
TOPICAL_OINTMENT | Freq: Two times a day (BID) | CUTANEOUS | Status: DC
  Administered 2011-01-18 – 2011-01-20 (×4): via TOPICAL
  Filled 2011-01-18: qty 15

## 2011-01-18 MED ORDER — POTASSIUM CHLORIDE IN NACL 20-0.9 MEQ/L-% IV SOLN
INTRAVENOUS | Status: DC
  Administered 2011-01-18 – 2011-01-20 (×2): via INTRAVENOUS
  Filled 2011-01-18 (×4): qty 1000

## 2011-01-18 MED ORDER — OXYCODONE HCL 5 MG PO TABS: 5.0000 mg | ORAL_TABLET | ORAL | Status: DC | PRN

## 2011-01-18 MED ORDER — ONDANSETRON HCL 4 MG PO TABS: 4.0000 mg | ORAL_TABLET | Freq: Four times a day (QID) | ORAL | Status: DC | PRN

## 2011-01-18 MED ORDER — METOCLOPRAMIDE HCL 5 MG/ML IJ SOLN
10.0000 mg | Freq: Once | INTRAMUSCULAR | Status: AC
  Administered 2011-01-18: 10 mg via INTRAVENOUS
  Filled 2011-01-18: qty 2

## 2011-01-18 MED ORDER — DOCUSATE SODIUM 100 MG PO CAPS
100.0000 mg | ORAL_CAPSULE | Freq: Two times a day (BID) | ORAL | Status: DC
  Administered 2011-01-19 – 2011-01-20 (×3): 100 mg via ORAL

## 2011-01-18 MED ORDER — LIDOCAINE-EPINEPHRINE 1 %-1:100000 IJ SOLN
INTRAMUSCULAR | Status: AC
  Administered 2011-01-18: 18:00:00
  Filled 2011-01-18: qty 1

## 2011-01-18 MED ORDER — PANTOPRAZOLE SODIUM 40 MG IV SOLR
40.0000 mg | Freq: Every day | INTRAVENOUS | Status: DC
  Administered 2011-01-18: 40 mg via INTRAVENOUS
  Filled 2011-01-18 (×3): qty 40

## 2011-01-18 MED ORDER — ONDANSETRON HCL 4 MG/2ML IJ SOLN
4.0000 mg | Freq: Four times a day (QID) | INTRAMUSCULAR | Status: DC | PRN
  Administered 2011-01-18 – 2011-01-19 (×2): 4 mg via INTRAVENOUS
  Filled 2011-01-18 (×2): qty 2

## 2011-01-18 MED ORDER — ONDANSETRON HCL 4 MG/2ML IJ SOLN
INTRAMUSCULAR | Status: AC
  Filled 2011-01-18: qty 2

## 2011-01-18 MED ORDER — IOHEXOL 300 MG/ML  SOLN
75.0000 mL | Freq: Once | INTRAMUSCULAR | Status: AC | PRN
  Administered 2011-01-18: 75 mL via INTRAVENOUS

## 2011-01-18 MED ORDER — FOLIC ACID 1 MG PO TABS
1.0000 mg | ORAL_TABLET | Freq: Every day | ORAL | Status: DC
  Administered 2011-01-19 – 2011-01-20 (×2): 1 mg via ORAL
  Filled 2011-01-18 (×3): qty 1

## 2011-01-18 MED ORDER — PANTOPRAZOLE SODIUM 40 MG PO TBEC
40.0000 mg | DELAYED_RELEASE_TABLET | Freq: Every day | ORAL | Status: DC
  Administered 2011-01-19: 40 mg via ORAL

## 2011-01-18 MED ORDER — MORPHINE SULFATE 2 MG/ML IJ SOLN: 2.0000 mg | INTRAMUSCULAR | Status: DC | PRN

## 2011-01-18 NOTE — ED Notes (Signed)
Was able to contact with pt sister, Lynden Ang. She is enroute to see pt, pt made aware

## 2011-01-18 NOTE — ED Notes (Signed)
Pt denies needing or wanting anything for pain at this time

## 2011-01-18 NOTE — ED Notes (Signed)
Pt back from CT and vomitng.  Dr. Weldon Inches saw patient and ordered zofran
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
## 2011-01-18 NOTE — ED Notes (Signed)
Pt cleared from c-collar per MD
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
## 2011-01-18 NOTE — ED Notes (Signed)
Pt cleared from back board per MD

## 2011-01-18 NOTE — ED Notes (Signed)
[id number] READY
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
## 2011-01-18 NOTE — ED Notes (Signed)
MD at bedside. 

## 2011-01-18 NOTE — ED Notes (Signed)
Patient transported to CT 

## 2011-01-19 ENCOUNTER — Encounter (HOSPITAL_COMMUNITY): Payer: Self-pay | Admitting: *Deleted

## 2011-01-19 LAB — CBC
Hemoglobin: 11.9 g/dL — ABNORMAL LOW (ref 12.0–15.0)
MCH: 32.8 pg (ref 26.0–34.0)
MCHC: 33.7 g/dL (ref 30.0–36.0)
Platelets: 90 10*3/uL — ABNORMAL LOW (ref 150–400)
RBC: 3.63 MIL/uL — ABNORMAL LOW (ref 3.87–5.11)

## 2011-01-19 LAB — BASIC METABOLIC PANEL
Calcium: 8.7 mg/dL (ref 8.4–10.5)
GFR calc non Af Amer: 64 mL/min — ABNORMAL LOW (ref >90)
Potassium: 3.4 mEq/L — ABNORMAL LOW (ref 3.5–5.1)
Sodium: 141 mEq/L (ref 135–145)

## 2011-01-19 MED ORDER — ACETAMINOPHEN 325 MG PO TABS
650.0000 mg | ORAL_TABLET | ORAL | Status: DC | PRN
  Administered 2011-01-20: 650 mg via ORAL
  Filled 2011-01-19: qty 2

## 2011-01-19 MED ORDER — INFLUENZA VIRUS VACC SPLIT PF IM SUSP
0.5000 mL | INTRAMUSCULAR | Status: AC
  Administered 2011-01-20: 0.5 mL via INTRAMUSCULAR
  Filled 2011-01-19: qty 0.5

## 2011-01-19 NOTE — ED Provider Notes (Signed)
I saw and evaluated the patient, reviewed the resident's procedure note and I was present during the critical components of the procedure and available at all times.  Hurman Horn, MD 01/19/11 [phone]
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
## 2011-01-19 NOTE — ED Provider Notes (Signed)
The patient remained awake and alert following commands well in the emergency department including normal bilateral finger to nose testing her occipital scalp laceration shows no obvious foreign body and is repaired by the emergency medicine resident under my supervision.and I was present during the critical components of the laceration assessment and repair.     Hurman Horn, MD 01/19/11 [phone]

## 2011-01-19 NOTE — ED Provider Notes (Signed)
LACERATION REPAIR Performed by: Elwin Mocha Authorized by: Elwin Mocha Consent: Verbal consent obtained. Risks and benefits: risks, benefits and alternatives were discussed Consent given by: patient Patient identity confirmed: provided demographic data Prepped and Draped in normal sterile fashion Wound explored  Laceration Location: occipital scalp  Laceration Length: 2 cm - shaped like inverted T  No Foreign Bodies seen or palpated  Anesthesia: local infiltration  Local anesthetic: lidocaine 6 mL of 1 % w/ epinephrine  Anesthetic total: 6ml  Irrigation method: syringe Amount of cleaning: standard  Skin closure: Staples  Number of sutures: 9  Technique: Staple Gun  Patient tolerance: Patient tolerated the procedure well with no immediate complications.   Elwin Mocha, MD 01/19/11 [phone]

## 2011-01-20 MED ORDER — DSS 100 MG PO CAPS: 100.0000 mg | ORAL_CAPSULE | Freq: Two times a day (BID) | ORAL | Status: DC

## 2011-01-20 MED ORDER — OXYCODONE HCL 5 MG PO TABS: 5.0000 mg | ORAL_TABLET | ORAL | Status: DC | PRN

## 2011-01-20 MED ORDER — BACITRACIN ZINC 500 UNIT/GM EX OINT: TOPICAL_OINTMENT | Freq: Two times a day (BID) | CUTANEOUS | Status: AC

## 2011-01-20 MED ORDER — ACETAMINOPHEN 325 MG PO TABS: 650.0000 mg | ORAL_TABLET | ORAL | Status: AC | PRN

## 2011-01-20 NOTE — Progress Notes (Signed)
Physical Therapy Evaluation Patient Details Name: LASHONE STAUBER MRN: [id number] DOB: 18-Mar-1945 Today's Date: 01/20/2011  Problem List:  Patient Active Problem List  Diagnoses  . ESOPHAGITIS, REFLUX  . PUD  . HIATAL HERNIA  . TRANSAMINASES, SERUM, ELEVATED    Past Medical History: History reviewed. No pertinent past medical history. Past Surgical History: No past surgical history on file.  PT Assessment/Plan/Recommendation PT Assessment Clinical Impression Statement: Pt at supervision level due to generalized weakness and anxiety. Pt does not need acute PT follow up at this time.  Recommend d/c home with 24 hour supervision. PT Recommendation/Assessment: Patent does not need any further PT services No Skilled PT: Patient is supervision for all activity/mobility PT Recommendation Follow Up Recommendations: None Equipment Recommended: None recommended by PT  PT Evaluation Precautions/Restrictions  Precautions Precautions: Fall Prior Functioning  Home Living Lives With: Alone Receives Help From: Family Type of Home: House Home Layout: One level;Laundry or work area in [employer] Access: Stairs to enter Entrance Stairs-Rails: None Secretary/administrator of Steps: 2 Additional Comments: my live with sister at d/c in 1 level home with 2-3 steps to enter Prior Function Level of Independence: Independent with basic ADLs;Independent with homemaking with ambulation;Independent with gait;Independent with transfers Driving: Yes Vocation: Retired Producer, television/film/video: Awake/alert Overall Cognitive Status: Appears within functional limits for tasks assessed Sensation/Coordination Sensation Light Touch: Appears Intact Extremity Assessment RLE Assessment RLE Assessment: Within Functional Limits LLE Assessment LLE Assessment: Within Functional Limits Mobility (including Balance) Bed Mobility Bed Mobility: Yes Supine to Sit: 6: Modified independent  (Device/Increase time) Transfers Transfers: Yes Sit to Stand: 5: Supervision Sit to Stand Details (indicate cue type and reason): cues for technique Stand Pivot Transfers: 5: Supervision Stand Pivot Transfer Details (indicate cue type and reason): close supervision due to pt hesitant, anxious with being on feet for first time Ambulation/Gait Ambulation/Gait: Yes Ambulation/Gait Assistance: 5: Supervision Ambulation/Gait Assistance Details (indicate cue type and reason): cues to look forward, pt with small steps, slow cadence to to anxiety, no LOB during gait Ambulation Distance (Feet): 150 Feet x 2 Assistive device: None Stairs: Yes Stairs Assistance: 5: Supervision Stairs Assistance Details (indicate cue type and reason): cues for step to pattern Stair Management Technique: No rails Number of Stairs: 2 x 2 Wheelchair Mobility Wheelchair Mobility: No  Posture/Postural Control Posture/Postural Control: No significant limitations Balance Balance Assessed: Yes Static Standing Balance Static Standing - Balance Support: No upper extremity supported;During functional activity (supervision) Dynamic Standing Balance Dynamic Standing - Balance Support: No upper extremity supported;During functional activity (supervision) End of Session PT - End of Session Equipment Utilized During Treatment: Gait belt Activity Tolerance: Patient tolerated treatment well Patient left: in bed;with call bell in reach Nurse Communication: Mobility status for ambulation;Mobility status for transfers General Behavior During Session: [facility] for tasks performed Cognition: [facility] for tasks performed  Ajay Strubel 01/20/2011, 10:59 AM
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
## 2011-01-20 NOTE — Discharge Summary (Signed)
Agree Melanie [name]  
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
## 2011-01-21 ENCOUNTER — Other Ambulatory Visit: Payer: Self-pay

## 2011-01-21 MED ORDER — ESOMEPRAZOLE MAGNESIUM 40 MG PO CPDR: 40.0000 mg | DELAYED_RELEASE_CAPSULE | Freq: Every day | ORAL | Status: DC

## 2011-01-21 NOTE — Progress Notes (Signed)
UR of chart completed. No HH needs identified at discharge.

## 2011-01-22 ENCOUNTER — Inpatient Hospital Stay (HOSPITAL_COMMUNITY)
Admission: EM | Admit: 2011-01-22 | Discharge: 2011-01-28 | DRG: 087 | Disposition: A | Discharge status: Home / Self Care | Payer: Medicare Other | Attending: Neurosurgery | Admitting: Neurosurgery

## 2011-01-22 ENCOUNTER — Emergency Department (HOSPITAL_COMMUNITY): Payer: Medicare Other

## 2011-01-22 DIAGNOSIS — I619 Nontraumatic intracerebral hemorrhage, unspecified: Secondary | ICD-10-CM

## 2011-01-22 DIAGNOSIS — IMO0002 Reserved for concepts with insufficient information to code with codable children: Secondary | ICD-10-CM

## 2011-01-22 DIAGNOSIS — Z79899 Other long term (current) drug therapy: Secondary | ICD-10-CM

## 2011-01-22 DIAGNOSIS — S02109A Fracture of base of skull, unspecified side, initial encounter for closed fracture: Principal | ICD-10-CM | POA: Diagnosis present

## 2011-01-22 LAB — CBC
HCT: 41.1 % (ref 36.0–46.0)
MCH: 33.4 pg (ref 26.0–34.0)
MCH: 33.1 pg (ref 26.0–34.0)
MCHC: 34.1 g/dL (ref 30.0–36.0)
MCHC: 33.5 g/dL (ref 30.0–36.0)
MCV: 98.1 fL (ref 78.0–100.0)
Platelets: 94 10*3/uL — ABNORMAL LOW (ref 150–400)
Platelets: 79 10*3/uL — ABNORMAL LOW (ref 150–400)
RBC: 3.66 MIL/uL — ABNORMAL LOW (ref 3.87–5.11)
RDW: 14.6 % (ref 11.5–15.5)

## 2011-01-22 LAB — URINE MICROSCOPIC-ADD ON

## 2011-01-22 LAB — DIFFERENTIAL
Basophils Absolute: 0 10*3/uL (ref 0.0–0.1)
Basophils Relative: 0 % (ref 0–1)
Eosinophils Absolute: 0.1 10*3/uL (ref 0.0–0.7)
Eosinophils Absolute: 0 10*3/uL (ref 0.0–0.7)
Eosinophils Relative: 1 % (ref 0–5)
Lymphocytes Relative: 17 % (ref 12–46)
Monocytes Absolute: 0.7 10*3/uL (ref 0.1–1.0)
Neutro Abs: 5.5 10*3/uL (ref 1.7–7.7)
Neutrophils Relative %: 72 % (ref 43–77)

## 2011-01-22 LAB — BASIC METABOLIC PANEL
Chloride: 104 mEq/L (ref 96–112)
Creatinine, Ser: 0.75 mg/dL (ref 0.50–1.10)
GFR calc Af Amer: >90 mL/min (ref >90)
Sodium: 139 mEq/L (ref 135–145)

## 2011-01-22 LAB — URINALYSIS, ROUTINE W REFLEX MICROSCOPIC
Bilirubin Urine: NEGATIVE
Ketones, ur: 40 mg/dL — AB
Nitrite: NEGATIVE
Protein, ur: NEGATIVE mg/dL
pH: 8 (ref 5.0–8.0)

## 2011-01-22 LAB — COMPREHENSIVE METABOLIC PANEL
ALT: 19 U/L (ref 0–35)
AST: 67 U/L — ABNORMAL HIGH (ref 0–37)
Albumin: 3.9 g/dL (ref 3.5–5.2)
Alkaline Phosphatase: 81 U/L (ref 39–117)
Chloride: 104 mEq/L (ref 96–112)
Potassium: 3.8 mEq/L (ref 3.5–5.1)
Sodium: 142 mEq/L (ref 135–145)
Total Bilirubin: 0.6 mg/dL (ref 0.3–1.2)
Total Protein: 7.2 g/dL (ref 6.0–8.3)

## 2011-01-22 LAB — PROTIME-INR
INR: 1.24 (ref 0.00–1.49)
Prothrombin Time: 15.9 seconds — ABNORMAL HIGH (ref 11.6–15.2)

## 2011-01-22 MED ORDER — ACETAMINOPHEN 325 MG PO TABS
650.0000 mg | ORAL_TABLET | Freq: Four times a day (QID) | ORAL | Status: DC | PRN
  Filled 2011-01-22 (×4): qty 2

## 2011-01-22 MED ORDER — SODIUM CHLORIDE 0.9 % IV SOLN
Freq: Once | INTRAVENOUS | Status: AC
  Administered 2011-01-22: 500 mL via INTRAVENOUS

## 2011-01-22 MED ORDER — ONDANSETRON HCL 4 MG/2ML IJ SOLN
4.0000 mg | Freq: Once | INTRAMUSCULAR | Status: AC
  Administered 2011-01-22: 4 mg via INTRAVENOUS
  Filled 2011-01-22: qty 2

## 2011-01-22 MED ORDER — PANTOPRAZOLE SODIUM 40 MG IV SOLR: 40.0000 mg | Freq: Every day | INTRAVENOUS | Status: DC

## 2011-01-22 MED ORDER — POTASSIUM CHLORIDE IN NACL 20-0.9 MEQ/L-% IV SOLN
INTRAVENOUS | Status: DC
  Administered 2011-01-23 – 2011-01-26 (×2): via INTRAVENOUS
  Filled 2011-01-22 (×8): qty 1000

## 2011-01-22 MED ORDER — MORPHINE SULFATE 4 MG/ML IJ SOLN
4.0000 mg | Freq: Once | INTRAMUSCULAR | Status: AC
  Administered 2011-01-22: 4 mg via INTRAVENOUS
  Filled 2011-01-22: qty 1

## 2011-01-22 MED ORDER — DOCUSATE SODIUM 100 MG PO CAPS: 100.0000 mg | ORAL_CAPSULE | Freq: Two times a day (BID) | ORAL | Status: DC

## 2011-01-22 MED ORDER — ACETAMINOPHEN 650 MG RE SUPP
650.0000 mg | Freq: Four times a day (QID) | RECTAL | Status: DC | PRN
  Administered 2011-01-25: 650 mg via RECTAL
  Filled 2011-01-22: qty 1

## 2011-01-22 MED ORDER — ONDANSETRON HCL 4 MG/2ML IJ SOLN
4.0000 mg | Freq: Four times a day (QID) | INTRAMUSCULAR | Status: DC | PRN
  Administered 2011-01-23 – 2011-01-25 (×3): 4 mg via INTRAVENOUS
  Filled 2011-01-22 (×4): qty 2
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
## 2011-01-22 NOTE — ED Notes (Signed)
Pt. Transported to floor via stretcher with Tech, Pt. Alert and oriented, NAD noted
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
## 2011-01-23 ENCOUNTER — Encounter (HOSPITAL_COMMUNITY): Payer: Self-pay | Admitting: *Deleted

## 2011-01-23 LAB — MRSA PCR SCREENING: MRSA by PCR: NEGATIVE

## 2011-01-23 MED ORDER — ACETAMINOPHEN 325 MG PO TABS
650.0000 mg | ORAL_TABLET | ORAL | Status: DC | PRN
  Administered 2011-01-23 – 2011-01-25 (×4): 650 mg via ORAL
  Filled 2011-01-23: qty 2

## 2011-01-23 MED ORDER — DEXAMETHASONE SODIUM PHOSPHATE 10 MG/ML IJ SOLN
10.0000 mg | Freq: Four times a day (QID) | INTRAMUSCULAR | Status: AC
  Administered 2011-01-23 (×4): 10 mg via INTRAVENOUS
  Filled 2011-01-23 (×4): qty 1

## 2011-01-23 MED ORDER — METHOTREXATE 2.5 MG PO TABS: 20.0000 mg | ORAL_TABLET | ORAL | Status: DC

## 2011-01-23 MED ORDER — PANTOPRAZOLE SODIUM 40 MG PO TBEC
80.0000 mg | DELAYED_RELEASE_TABLET | Freq: Every day | ORAL | Status: DC
  Administered 2011-01-23 – 2011-01-28 (×6): 80 mg via ORAL
  Filled 2011-01-23: qty 2
  Filled 2011-01-23: qty 1
  Filled 2011-01-23: qty 2
  Filled 2011-01-23 (×3): qty 1
  Filled 2011-01-23: qty 2

## 2011-01-23 MED ORDER — METHOTREXATE 2.5 MG PO TABS
20.0000 mg | ORAL_TABLET | ORAL | Status: DC
  Administered 2011-01-23 (×2): 20 mg via ORAL
  Filled 2011-01-23: qty 8

## 2011-01-23 MED ORDER — DOCUSATE SODIUM 100 MG PO CAPS
100.0000 mg | ORAL_CAPSULE | Freq: Two times a day (BID) | ORAL | Status: DC
  Administered 2011-01-23 – 2011-01-28 (×9): 100 mg via ORAL
  Filled 2011-01-23 (×10): qty 1

## 2011-01-23 MED ORDER — FOLIC ACID 1 MG PO TABS
1.0000 mg | ORAL_TABLET | Freq: Every day | ORAL | Status: DC
  Administered 2011-01-23 – 2011-01-28 (×6): 1 mg via ORAL
  Filled 2011-01-23 (×6): qty 1

## 2011-01-23 MED ORDER — ABATACEPT 250 MG IV SOLR: 250.0000 mg | INTRAVENOUS | Status: DC
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
## 2011-01-24 NOTE — Progress Notes (Signed)
Doing well. C/o HA No Numbness, tingling, weakness still Nausea Amb/ voiding well  Temp:  [97.6 F (36.4 C)-98.3 F (36.8 C)] 97.9 F (36.6 C) (12/30 0524) Pulse Rate:  [61-73] 73  (12/30 0524) Resp:  [16-22] 18  (12/30 0524) BP: (115-153)/(59-81) 115/68 mmHg (12/30 0524) SpO2:  [98 %-99 %] 99 % (12/30 0524) Good strength and sensation, no drift , cn's II-XII intact   Plan: Cont observation,

## 2011-01-25 ENCOUNTER — Encounter (HOSPITAL_COMMUNITY): Payer: Self-pay | Admitting: Radiology

## 2011-01-25 ENCOUNTER — Inpatient Hospital Stay (HOSPITAL_COMMUNITY): Payer: Medicare Other

## 2011-01-25 MED ORDER — TRAMADOL HCL 50 MG PO TABS
50.0000 mg | ORAL_TABLET | Freq: Four times a day (QID) | ORAL | Status: DC
  Administered 2011-01-25 – 2011-01-28 (×13): 50 mg via ORAL
  Filled 2011-01-25 (×16): qty 1

## 2011-01-25 NOTE — Progress Notes (Signed)
01/25/11  At approx 0100, pt called RN and stated she had worsening HA.  Pt also began to have nausea and vomited once a small amount of clear emesis with an orange tint.  Pt able to fall asleep after acetaminophen and zofran. VS WNL, A&Ox4. No N/T. Will continue to monitor. KPotts RN

## 2011-01-25 NOTE — Progress Notes (Signed)
Patient ID: Melanie [name], female   DOB: 02/16/1945, 65 y.o.   MRN: [id number] Subjective: Patient reports Complains of headache  Objective: Vital signs in last 24 hours: Temp:  [98.2 F (36.8 C)-98.8 F (37.1 C)] 98.5 F (36.9 C) (12/31 0447) Pulse Rate:  [61-69] 65  (12/31 0447) Resp:  [16-20] 20  (12/31 0447) BP: (124-148)/(65-74) 148/73 mmHg (12/31 0447) SpO2:  [96 %-100 %] 100 % (12/31 0447)  Intake/Output from previous day:   Intake/Output this shift:    Awake akert oriented. No focal deficit  Lab Results:  Basename 01/22/11 2239 01/22/11 1630  WBC 7.6 8.5  HGB 12.1 14.0  HCT 36.1 41.1  PLT 79* 94*   BMET  Basename 01/22/11 2239 01/22/11 1630  NA 139 142  K 4.0 3.8  CL 104 104  CO2 26 25  GLUCOSE 80 82  BUN 7 7  CREATININE 0.75 0.75  CALCIUM 8.3* 9.2    Studies/Results: No results found.  Assessment/Plan: Will re check CT due to persistent headache  LOS: 3 days  As above   Reinaldo Meeker, MD 01/25/2011, 9:06 AM

## 2011-01-25 NOTE — Progress Notes (Signed)
Utilization review completed. Maximillian Habibi, RN, BSN. 01/25/11  

## 2011-01-26 MED ORDER — SODIUM CHLORIDE 0.9 % IV SOLN
INTRAVENOUS | Status: DC
  Administered 2011-01-26 – 2011-01-28 (×3): via INTRAVENOUS

## 2011-01-26 NOTE — Progress Notes (Signed)
Subjective: Patient reports still having headaches.  Wants to see if they are better controlled today.  Objective: Vital signs in last 24 hours: Temp:  [98.4 F (36.9 C)-99.2 F (37.3 C)] 98.4 F (36.9 C) (12/31 2200) Pulse Rate:  [65-71] 65  (12/31 2200) Resp:  [20] 20  (12/31 2200) BP: (144-150)/(70-76) 144/76 mmHg (12/31 2200) SpO2:  [99 %-100 %] 100 % (12/31 2200)  Intake/Output from previous day:   Intake/Output this shift:    Physical Exam: Stable  Lab Results: No results found for this basename: WBC:2,HGB:2,HCT:2,PLT:2 in the last 72 hours BMET No results found for this basename: NA:2,K:2,CL:2,CO2:2,GLUCOSE:2,BUN:2,CREATININE:2,CALCIUM:2 in the last 72 hours  Studies/Results: Ct Head Wo Contrast  01/25/2011  *RADIOLOGY REPORT*  Clinical Data: Follow-up closed head injury.  CT HEAD WITHOUT CONTRAST  Technique:  Contiguous axial images were obtained from the base of the skull through the vertex without contrast.  Comparison: CT head without contrast 01/22/2011.  Findings: A nondisplaced fracture the right occipital bone is again noted.  There is a lentiform extra-axial hemorrhage along the inferior aspect of the right posterior fossa.  This is likely below the level of the transverse sinus.  The previously seen left frontal extra-axial collection is less conspicuous on today's exam. There is no associated anterior fracture.  Hyperdensity along the posterior falx is also diminished in conspicuity compared the prior study. Blood within the ventricles is smaller than previously noted.  There is no ventricular enlargement or hydrocephalus.  Mild atrophy white matter changes stable.  IMPRESSION:  1.  Stable appearance of nondisplaced right skull base fracture extending in the right occipital lobe. 2.  Relatively stable appearance of extra-axial hemorrhage along the inferior right cerebellum.  This may represent a venous epidural although appears below the level of the transverse sinus.  3.  Decreasing conspicuity of left anterior frontal extra-axial hemorrhage, blood along the posterior falx, and intraventricular hemorrhage.  There is no hydrocephalus. 4.  Stable atrophy and white matter disease.  Original Report Authenticated By: Jamesetta Orleans. MATTERN, M.D.    Assessment/Plan: Patient slowly improving following head injury. Head CT improving.  I reassured patient that she will continue to improve, but will also continue to have headaches for the foreseeable future. Hopefully, will be feeling well enough to D/C in am (plans to stay with her sister).    LOS: 4 days    Dorian Heckle, MD 01/26/2011, 7:36 AM

## 2011-01-26 NOTE — Progress Notes (Signed)
Spoke to Dr. Venetia Maxon, informed him that pt has a poor appetite and hasn't eaten or drank much today.  New orders given for NS + KCL 10 meq @ 75cc/hr and Chem 7 to be done in the am.  Will continue to monitor. A. Magaby Rumberger, RN

## 2011-01-27 LAB — BASIC METABOLIC PANEL
Calcium: 8.6 mg/dL (ref 8.4–10.5)
Creatinine, Ser: 0.81 mg/dL (ref 0.50–1.10)
GFR calc Af Amer: 86 mL/min — ABNORMAL LOW (ref >90)
GFR calc non Af Amer: 75 mL/min — ABNORMAL LOW (ref >90)
Sodium: 133 mEq/L — ABNORMAL LOW (ref 135–145)

## 2011-01-27 NOTE — Progress Notes (Signed)
Subjective: Patient reports Patient doing much better this afternoon headache no role nausea this has relieved much  Objective: Vital signs in last 24 hours: Temp:  [98 F (36.7 C)-98.9 F (37.2 C)] 98.9 F (37.2 C) (01/02 1341) Pulse Rate:  [68-92] 76  (01/02 1341) Resp:  [18-24] 18  (01/02 1341) BP: (123-131)/(57-78) 131/57 mmHg (01/02 1341) SpO2:  [98 %-100 %] 100 % (01/02 1341)  Intake/Output from previous day:   Intake/Output this shift:    She is awake alert oriented x4 cranial nerves are intact strength is 5 of 5 in her upper looks to upper and lower extremities  Lab Results: No results found for this basename: WBC:2,HGB:2,HCT:2,PLT:2 in the last 72 hours BMET  Basename 01/27/11 0530  NA 133*  K 3.8  CL 99  CO2 25  GLUCOSE 79  BUN 9  CREATININE 0.81  CALCIUM 8.6    Studies/Results: No results found.  Assessment/Plan: Patient status post closed head injury slowly making progress headache and nausea improved today we'll continue to watch her overnight ambulate increase her by mouth intake and consider discharge in the morning  LOS: 5 days     Boneta Standre P 01/27/2011, 4:58 PM

## 2011-01-28 MED ORDER — PROMETHAZINE HCL 12.5 MG PO TABS: 12.5000 mg | ORAL_TABLET | Freq: Four times a day (QID) | ORAL | Status: AC | PRN

## 2011-01-28 NOTE — Progress Notes (Signed)
Subjective: Patient reports Patient's feeling better this morning no headache last night no nausea spine she did dinner and she's the [place] breakfast  Objective: Vital signs in last 24 hours: Temp:  [97 F (36.1 C)-98.9 F (37.2 C)] 97.1 F (36.2 C) (01/03 0601) Pulse Rate:  [71-77] 71  (01/03 0601) Resp:  [17-18] 18  (01/03 0601) BP: (131-139)/(57-68) 139/68 mmHg (01/03 0601) SpO2:  [99 %-100 %] 100 % (01/03 0601)  Intake/Output from previous day: 01/02 0701 - 01/03 0700 In: 975 [P.O.:150; I.V.:825] Out: -  Intake/Output this shift:    To light extremities are intact strength is 5 out of 5 no pronator drift  Lab Results: No results found for this basename: WBC:2,HGB:2,HCT:2,PLT:2 in the last 72 hours BMET  Basename 01/27/11 0530  NA 133*  K 3.8  CL 99  CO2 25  GLUCOSE 79  BUN 9  CREATININE 0.81  CALCIUM 8.6    Studies/Results: No results found.  Assessment/Plan: 66 year old female closed injury right posterior fossa small epidural doing very well now one week status post closed head injury plan discharge this morning  LOS: 6 days     Xavien Dauphinais P 01/28/2011, 7:44 AM

## 2011-01-28 NOTE — Progress Notes (Signed)
Utilization review completed. Adeyemi Hamad, RN, BSN   01/28/11  
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
## 2011-11-04 ENCOUNTER — Encounter (HOSPITAL_COMMUNITY): Payer: Self-pay | Admitting: Emergency Medicine

## 2011-11-04 ENCOUNTER — Emergency Department (HOSPITAL_COMMUNITY): Payer: Medicare Other

## 2011-11-04 ENCOUNTER — Emergency Department (HOSPITAL_COMMUNITY)
Admission: EM | Admit: 2011-11-04 | Discharge: 2011-11-05 | Disposition: A | Discharge status: Home / Self Care | Payer: Medicare Other | Attending: Emergency Medicine | Admitting: Emergency Medicine

## 2011-11-04 DIAGNOSIS — M129 Arthropathy, unspecified: Secondary | ICD-10-CM | POA: Insufficient documentation

## 2011-11-04 DIAGNOSIS — Z79899 Other long term (current) drug therapy: Secondary | ICD-10-CM | POA: Insufficient documentation

## 2011-11-04 DIAGNOSIS — F29 Unspecified psychosis not due to a substance or known physiological condition: Secondary | ICD-10-CM | POA: Insufficient documentation

## 2011-11-04 DIAGNOSIS — R4182 Altered mental status, unspecified: Secondary | ICD-10-CM | POA: Insufficient documentation

## 2011-11-04 DIAGNOSIS — N390 Urinary tract infection, site not specified: Secondary | ICD-10-CM

## 2011-11-04 HISTORY — DX: Unspecified osteoarthritis, unspecified site: M19.90

## 2011-11-04 LAB — URINE MICROSCOPIC-ADD ON

## 2011-11-04 LAB — URINALYSIS, ROUTINE W REFLEX MICROSCOPIC
Bilirubin Urine: NEGATIVE
Nitrite: NEGATIVE
Specific Gravity, Urine: <1.005 — ABNORMAL LOW (ref 1.005–1.030)
Urobilinogen, UA: 0.2 mg/dL (ref 0.0–1.0)
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
## 2011-11-04 NOTE — ED Notes (Addendum)
Advised by triage nurse sister just told pt coming for blood work. But really here for psych evaul. States pt does not remember her cats thinks they are someone else's. sister is having to pay all the pt bills
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
## 2011-11-04 NOTE — ED Provider Notes (Signed)
History   This chart was scribed for Benny Lennert, MD by Gerlean Ren. This patient was seen in room APA19/APA19 and the patient's care was started at 23:15.   CSN: [id number]  Arrival date & time 11/04/11  2147   First MD Initiated Contact with Patient 11/04/11 2314      Chief Complaint  Patient presents with  . Altered Mental Status    (Consider location/radiation/quality/duration/timing/severity/associated sxs/prior treatment) Patient is a 66 y.o. female presenting with altered mental status. The history is provided by a relative. No language interpreter was used.  Altered Mental Status This is a new problem. The current episode started 2 days ago. The problem occurs constantly. The problem has been gradually worsening. Pertinent negatives include no chest pain, no abdominal pain and no headaches. Nothing aggravates the symptoms. Nothing relieves the symptoms. She has tried nothing for the symptoms.   Melanie [name] is a 66 y.o. female who presents to the Emergency Department brought in by her sister.  Per sister, pt has become more and more confused over past month, with confusion suddenly and dramatically increasing over past 2 days.  Per sister, pt has pets at home that she thinks belong to other people that have been in her house, but sister reports this is all false.  Pt denies fever, neck pain, sore throat, visual disturbance, CP, cough, SOB, abdominal pain, nausea, emesis, diarrhea, urinary symptoms, back pain, HA, weakness, numbness and rash as associated symptoms.  Pt denies tobacco and alcohol use.     Past Medical History  Diagnosis Date  . Arthritis     History reviewed. No pertinent past surgical history.  History reviewed. No pertinent family history.  History  Substance Use Topics  . Smoking status: Never Smoker   . Smokeless tobacco: Not on file  . Alcohol Use: No    No OB history provided.  Review of Systems  Constitutional: Negative for fatigue.    HENT: Negative for congestion, sinus pressure and ear discharge.   Eyes: Negative for discharge.  Respiratory: Negative for cough.   Cardiovascular: Negative for chest pain.  Gastrointestinal: Negative for abdominal pain and diarrhea.  Genitourinary: Negative for frequency and hematuria.  Musculoskeletal: Negative for back pain.  Skin: Negative for rash.  Neurological: Negative for seizures and headaches.  Hematological: Negative.   Psychiatric/Behavioral: Positive for confusion and altered mental status.    Allergies  Review of patient's allergies indicates no known allergies.  Home Medications   Current Outpatient Rx  Name Route Sig Dispense Refill  . ABATACEPT 250 MG IV SOLR Intravenous Inject 250 mg into the vein every 28 (twenty-eight) days.      . ACETAMINOPHEN 325 MG PO TABS Oral Take 650 mg by mouth daily as needed. For occasional headache/pain    . FOLIC ACID 1 MG PO TABS Oral Take 1 mg by mouth daily.      Marland Kitchen METHOTREXATE SODIUM 2.5 MG PO TABS Oral Take 20 mg by mouth once a week. On saturday      BP 132/70  Pulse 64  Temp 98.2 F (36.8 C) (Oral)  Resp 16  Ht 5\' 1"  (1.549 m)  Wt 115 lb (52.164 kg)  BMI 21.73 kg/m2  SpO2 100%  Physical Exam  Nursing note and vitals reviewed. Constitutional: She is oriented to person, place, and time. She appears well-developed.  HENT:  Head: Normocephalic and atraumatic.  Mouth/Throat: Oropharynx is clear and moist.  Eyes: Conjunctivae normal and EOM are normal. No scleral  icterus.  Neck: Neck supple. No thyromegaly present.  Cardiovascular: Normal rate and regular rhythm.  Exam reveals no gallop and no friction rub.   No murmur heard. Pulmonary/Chest: Effort normal. No stridor. She has no wheezes. She has no rales. She exhibits no tenderness.  Abdominal: She exhibits no distension. There is no tenderness. There is no rebound.  Musculoskeletal: Normal range of motion. She exhibits no edema.  Lymphadenopathy:    She has no  cervical adenopathy.  Neurological: She is alert and oriented to person, place, and time. Coordination normal.  Skin: Skin is warm. No rash noted. No erythema.  Psychiatric: She has a normal mood and affect. Her behavior is normal.    ED Course  Procedures (including critical care time) DIAGNOSTIC STUDIES: Oxygen Saturation is 100% on room air, normal by my interpretation.    COORDINATION OF CARE: 23:21- Patient and sister informed of clinical course, understand medical decision-making process, and agree with plan.  Ordered CBC, c-met, urinalysis, head CT, and chest XR.     Labs Reviewed - No data to display Results for orders placed during the hospital encounter of 11/04/11  URINALYSIS, ROUTINE W REFLEX MICROSCOPIC      Component Value Range   Color, Urine YELLOW  YELLOW   APPearance CLEAR  CLEAR   Specific Gravity, Urine <1.005 (*) 1.005 - 1.030   pH 5.5  5.0 - 8.0   Glucose, UA NEGATIVE  NEGATIVE mg/dL   Hgb urine dipstick SMALL (*) NEGATIVE   Bilirubin Urine NEGATIVE  NEGATIVE   Ketones, ur NEGATIVE  NEGATIVE mg/dL   Protein, ur NEGATIVE  NEGATIVE mg/dL   Urobilinogen, UA 0.2  0.0 - 1.0 mg/dL   Nitrite NEGATIVE  NEGATIVE   Leukocytes, UA SMALL (*) NEGATIVE  URINE MICROSCOPIC-ADD ON      Component Value Range   Squamous Epithelial / LPF FEW (*) RARE   WBC, UA 3-6  <3 WBC/hpf   RBC / HPF 3-6  <3 RBC/hpf   Bacteria, UA FEW (*) RARE    No results found.   No diagnosis found.    MDM   The chart was scribed for me under my direct supervision.  I personally performed the history, physical, and medical decision making and all procedures in the evaluation of this patient.Benny Lennert, MD 11/05/11 0040

## 2011-11-05 LAB — COMPREHENSIVE METABOLIC PANEL
ALT: 13 U/L (ref 0–35)
BUN: 8 mg/dL (ref 6–23)
CO2: 22 mEq/L (ref 19–32)
Calcium: 9.4 mg/dL (ref 8.4–10.5)
Creatinine, Ser: 0.85 mg/dL (ref 0.50–1.10)
GFR calc Af Amer: 82 mL/min — ABNORMAL LOW (ref >90)
GFR calc non Af Amer: 70 mL/min — ABNORMAL LOW (ref >90)
Glucose, Bld: 87 mg/dL (ref 70–99)
Sodium: 139 mEq/L (ref 135–145)
Total Protein: 6.6 g/dL (ref 6.0–8.3)

## 2011-11-05 LAB — CBC WITH DIFFERENTIAL/PLATELET
Eosinophils Absolute: 0.1 10*3/uL (ref 0.0–0.7)
Eosinophils Relative: 1 % (ref 0–5)
HCT: 40.7 % (ref 36.0–46.0)
Hemoglobin: 13.6 g/dL (ref 12.0–15.0)
Lymphocytes Relative: 31 % (ref 12–46)
Lymphs Abs: 1.8 10*3/uL (ref 0.7–4.0)
MCH: 34.2 pg — ABNORMAL HIGH (ref 26.0–34.0)
MCV: 102.3 fL — ABNORMAL HIGH (ref 78.0–100.0)
Monocytes Absolute: 0.9 10*3/uL (ref 0.1–1.0)
Monocytes Relative: 16 % — ABNORMAL HIGH (ref 3–12)
RBC: 3.98 MIL/uL (ref 3.87–5.11)
WBC: 5.8 10*3/uL (ref 4.0–10.5)

## 2011-11-05 MED ORDER — CEPHALEXIN 500 MG PO CAPS
500.0000 mg | ORAL_CAPSULE | Freq: Once | ORAL | Status: AC
  Administered 2011-11-05: 500 mg via ORAL
  Filled 2011-11-05: qty 1

## 2011-11-05 MED ORDER — CEPHALEXIN 500 MG PO CAPS: 500.0000 mg | ORAL_CAPSULE | Freq: Four times a day (QID) | ORAL | Status: DC

## 2011-11-05 NOTE — ED Notes (Signed)
Pt alert & oriented x4, stable gait. Patient  given discharge instructions, paperwork & prescription(s). Patient verbalized understanding. Pt left department w/ no further questions. 

## 2011-11-06 LAB — URINE CULTURE

## 2012-05-17 LAB — PACEMAKER DEVICE OBSERVATION

## 2012-09-24 ENCOUNTER — Encounter: Payer: Self-pay | Admitting: *Deleted

## 2012-09-28 ENCOUNTER — Encounter: Payer: Self-pay | Admitting: Cardiovascular Disease

## 2012-09-29 ENCOUNTER — Ambulatory Visit (INDEPENDENT_AMBULATORY_CARE_PROVIDER_SITE_OTHER): Payer: Medicare Other | Admitting: Cardiovascular Disease

## 2012-09-29 ENCOUNTER — Encounter: Payer: Self-pay | Admitting: Cardiovascular Disease

## 2012-09-29 ENCOUNTER — Other Ambulatory Visit: Payer: Self-pay | Admitting: Cardiovascular Disease

## 2012-09-29 VITALS — BP 146/82 | HR 80 | Resp 16 | Ht 61.0 in | Wt 124.4 lb

## 2012-09-29 DIAGNOSIS — I495 Sick sinus syndrome: Secondary | ICD-10-CM

## 2012-09-29 DIAGNOSIS — Z950 Presence of cardiac pacemaker: Secondary | ICD-10-CM

## 2012-09-29 LAB — PACEMAKER DEVICE OBSERVATION
AL AMPLITUDE: 1.4 mv
AL IMPEDENCE PM: 453 Ohm
BATTERY VOLTAGE: 2.76 V
RV LEAD AMPLITUDE: 8 mv

## 2012-09-29 NOTE — Progress Notes (Signed)
In office pacemaker interrogation. Normal device function. No changes made this session. 

## 2012-09-29 NOTE — Patient Instructions (Addendum)
Remote monitoring is used to monitor your Pacemaker of ICD from home. This monitoring reduces the number of office visits required to check your device to one time per year. It allows [place] to keep an eye on the functioning of your device to ensure it is working properly. You are scheduled for a device check from home on 01-01-2013. You may send your transmission at any time that day. If you have a wireless device, the transmission will be sent automatically. After your physician reviews your transmission, you will receive a postcard with your next transmission date.  Your physician recommends that you schedule a follow-up appointment in: 1 YEAR

## 2012-10-08 ENCOUNTER — Encounter: Payer: Self-pay | Admitting: Cardiovascular Disease

## 2012-10-08 DIAGNOSIS — Z950 Presence of cardiac pacemaker: Secondary | ICD-10-CM | POA: Insufficient documentation
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
## 2012-10-08 NOTE — Assessment & Plan Note (Signed)
Full interrogation in the office shows normal device function. No permanent changes to make the device settings. There is roughly 50% atrial pacing virtually no ventricular pacing. No meaningful ventricular or atrial tachyarrhythmia has been recorded. Continue with care link remote checks every 3 months in followup in the office in one year

## 2013-03-25 ENCOUNTER — Encounter (HOSPITAL_COMMUNITY): Payer: Self-pay | Admitting: Emergency Medicine

## 2013-03-25 ENCOUNTER — Emergency Department (HOSPITAL_COMMUNITY)
Admission: EM | Admit: 2013-03-25 | Discharge: 2013-03-25 | Disposition: A | Discharge status: Home / Self Care | Payer: Medicare Other | Attending: Emergency Medicine | Admitting: Emergency Medicine

## 2013-03-25 DIAGNOSIS — Y9389 Activity, other specified: Secondary | ICD-10-CM | POA: Insufficient documentation

## 2013-03-25 DIAGNOSIS — Z950 Presence of cardiac pacemaker: Secondary | ICD-10-CM | POA: Insufficient documentation

## 2013-03-25 DIAGNOSIS — Z043 Encounter for examination and observation following other accident: Secondary | ICD-10-CM | POA: Insufficient documentation

## 2013-03-25 DIAGNOSIS — M129 Arthropathy, unspecified: Secondary | ICD-10-CM | POA: Insufficient documentation

## 2013-03-25 DIAGNOSIS — Z79899 Other long term (current) drug therapy: Secondary | ICD-10-CM | POA: Insufficient documentation

## 2013-03-25 DIAGNOSIS — Y9210 Unspecified residential institution as the place of occurrence of the external cause: Secondary | ICD-10-CM | POA: Insufficient documentation

## 2013-03-25 DIAGNOSIS — I10 Essential (primary) hypertension: Secondary | ICD-10-CM | POA: Insufficient documentation

## 2013-03-25 DIAGNOSIS — F0390 Unspecified dementia without behavioral disturbance: Secondary | ICD-10-CM | POA: Insufficient documentation

## 2013-03-25 DIAGNOSIS — W19XXXA Unspecified fall, initial encounter: Secondary | ICD-10-CM

## 2013-03-25 DIAGNOSIS — R296 Repeated falls: Secondary | ICD-10-CM | POA: Insufficient documentation

## 2013-03-25 NOTE — ED Notes (Signed)
Taken to bathroom via w/c, pt able to ambulate w/o difficulty. Urinated large amt w/o difficulty. I noted greenish/purple bruising on buttocks in  odd finger sized marks. Also noted red area in crack of buttocks, no broken skin. Jasmine @ [employer].

## 2013-03-25 NOTE — ED Notes (Signed)
Alert to present, pleasant, denies pain anywhere. Moves all extremities with ease and no pain to palpation anywhere.

## 2013-03-25 NOTE — ED Provider Notes (Signed)
CSN: [id number]     Arrival date & time 03/25/13  1945 History  This chart was scribed for Donnetta Hutching, MD by Bennett Scrape, ED Scribe. This patient was seen in room APA16A/APA16A and the patient's care was started at 8:05 PM.   Chief Complaint  Patient presents with  . Fall   Level 5 Caveat - Dementia  The history is provided by the EMS personnel. No language interpreter was used.    HPI Comments: Melanie [name] is a 68 y.o. female brought in by ambulance from [facility], who presents to the Emergency Department for a suspected fall after being found on the floor this evening. Pt is unsure how she ended up on the fall. She denies any pain including extremity pain, HA and neck pain, head trauma.  She has absolutely no c/o.  Past Medical History  Diagnosis Date  . Arthritis   . Sinus node dysfunction     permanent pacemaker 10/06/2006  . Hypertension   . Paroxysmal atrial tachycardia     infrequent episodes   Past Surgical History  Procedure Laterality Date  . Appendectomy  1961  . Breast cyst excision  1970    left - benign  . Breast cyst excision  1972    right - benign  . Cyst excision  1989    behind ear  . Permanent pacemaker insertion  10/06/2006  . US echocardiography  06/03/2009    EF 55%,trace AI  . Nm myocar perf wall motion  06/04/2009    Normal   Family History  Problem Relation Age of Onset  . Heart failure Father   . Cancer Father   . Cancer Sister   . Cancer Brother    History  Substance Use Topics  . Smoking status: Never Smoker   . Smokeless tobacco: Not on file  . Alcohol Use: No   No OB history provided.  Review of Systems  Unable to perform ROS: Dementia      Allergies  Review of patient's allergies indicates no known allergies.  Home Medications   Current Outpatient Rx  Name  Route  Sig  Dispense  Refill  . abatacept (ORENCIA) 250 MG injection   Intravenous   Inject 250 mg into the vein every 28 (twenty-eight) days.           Marland Kitchen acetaminophen (TYLENOL) 325 MG tablet   Oral   Take 650 mg by mouth daily as needed. For occasional headache/pain         . esomeprazole (NEXIUM) 20 MG capsule   Oral   Take 20 mg by mouth daily before breakfast.         . folic acid (FOLVITE) 1 MG tablet   Oral   Take 1 mg by mouth daily.           Marland Kitchen KRILL OIL PO   Oral   Take by mouth.         . methotrexate 2.5 MG tablet   Oral   Take 20 mg by mouth once a week. On saturday         . Multiple Vitamin (MULTIVITAMIN) tablet   Oral   Take 1 tablet by mouth daily.          Triage Vitals: BP 122/54  Pulse 84  Temp(Src) 98.9 F (37.2 C) (Oral)  Resp 18  SpO2 96%  Physical Exam  Nursing note and vitals reviewed. Constitutional: She appears well-developed and well-nourished.  HENT:  Head: Normocephalic  and atraumatic.  Eyes: Conjunctivae and EOM are normal. Pupils are equal, round, and reactive to light.  Neck: Normal range of motion. Neck supple.  No c-spine tenderness  Cardiovascular: Normal rate, regular rhythm and normal heart sounds.   Pulmonary/Chest: Effort normal and breath sounds normal.  Abdominal: Soft. Bowel sounds are normal.  Musculoskeletal: Normal range of motion.  FROM of all extremities.   Neurological: She is alert.  Skin: Skin is warm and dry.  Psychiatric: She has a normal mood and affect. Her behavior is normal.    ED Course  Procedures (including critical care time)  DIAGNOSTIC STUDIES: Oxygen Saturation is 96% on RA, adequate by my interpretation.    COORDINATION OF CARE: 8:07 PM-Discussed discharge plan which includes transfer back to [facility] with pt and pt agreed to plan.   Labs Review Labs Reviewed - No data to display Imaging Review No results found.   EKG Interpretation None      MDM   Final diagnoses:  Fall  No bony tenderness. No neurological deficits. No imaging necessary  I personally performed the services described in this documentation,  which was scribed in my presence. The recorded information has been reviewed and is accurate.      Donnetta Hutching, MD 03/25/13 2023

## 2013-03-25 NOTE — ED Notes (Signed)
Per EMS: pt is resident of [address]; pt was found on the floor after suspected fall. Pt unsure as to how she ended up on floor. Per EMS: no neurological deficits or c/o pain.

## 2013-03-25 NOTE — Discharge Instructions (Signed)
No x-rays necessary. Followup your Dr.

## 2013-03-26 ENCOUNTER — Emergency Department (HOSPITAL_COMMUNITY): Payer: Medicare Other

## 2013-03-26 ENCOUNTER — Encounter (HOSPITAL_COMMUNITY): Payer: Self-pay | Admitting: Emergency Medicine

## 2013-03-26 ENCOUNTER — Emergency Department (HOSPITAL_COMMUNITY)
Admission: EM | Admit: 2013-03-26 | Discharge: 2013-03-26 | Disposition: A | Discharge status: Home / Self Care | Payer: Medicare Other | Attending: Emergency Medicine | Admitting: Emergency Medicine

## 2013-03-26 DIAGNOSIS — M069 Rheumatoid arthritis, unspecified: Secondary | ICD-10-CM | POA: Insufficient documentation

## 2013-03-26 DIAGNOSIS — F29 Unspecified psychosis not due to a substance or known physiological condition: Secondary | ICD-10-CM | POA: Insufficient documentation

## 2013-03-26 DIAGNOSIS — I10 Essential (primary) hypertension: Secondary | ICD-10-CM | POA: Insufficient documentation

## 2013-03-26 DIAGNOSIS — Z79899 Other long term (current) drug therapy: Secondary | ICD-10-CM | POA: Insufficient documentation

## 2013-03-26 DIAGNOSIS — R5381 Other malaise: Secondary | ICD-10-CM | POA: Insufficient documentation

## 2013-03-26 DIAGNOSIS — R5383 Other fatigue: Secondary | ICD-10-CM

## 2013-03-26 DIAGNOSIS — R112 Nausea with vomiting, unspecified: Secondary | ICD-10-CM | POA: Insufficient documentation

## 2013-03-26 DIAGNOSIS — Z950 Presence of cardiac pacemaker: Secondary | ICD-10-CM | POA: Insufficient documentation

## 2013-03-26 DIAGNOSIS — R1110 Vomiting, unspecified: Secondary | ICD-10-CM

## 2013-03-26 DIAGNOSIS — R197 Diarrhea, unspecified: Secondary | ICD-10-CM | POA: Insufficient documentation

## 2013-03-26 LAB — CBC WITH DIFFERENTIAL/PLATELET
BASOS ABS: 0 10*3/uL (ref 0.0–0.1)
BASOS PCT: 0 % (ref 0–1)
EOS ABS: 0 10*3/uL (ref 0.0–0.7)
Eosinophils Relative: 0 % (ref 0–5)
HEMATOCRIT: 41.4 % (ref 36.0–46.0)
HEMOGLOBIN: 13.7 g/dL (ref 12.0–15.0)
LYMPHS ABS: 1 10*3/uL (ref 0.7–4.0)
Lymphocytes Relative: 15 % (ref 12–46)
MCH: 32 pg (ref 26.0–34.0)
MCHC: 33.1 g/dL (ref 30.0–36.0)
MCV: 96.7 fL (ref 78.0–100.0)
Monocytes Absolute: 0.4 10*3/uL (ref 0.1–1.0)
Monocytes Relative: 6 % (ref 3–12)
NEUTROS ABS: 5.2 10*3/uL (ref 1.7–7.7)
Neutrophils Relative %: 79 % — ABNORMAL HIGH (ref 43–77)
Platelets: 80 10*3/uL — ABNORMAL LOW (ref 150–400)
RBC: 4.28 MIL/uL (ref 3.87–5.11)
RDW: 14.4 % (ref 11.5–15.5)
WBC: 6.6 10*3/uL (ref 4.0–10.5)

## 2013-03-26 LAB — URINALYSIS, ROUTINE W REFLEX MICROSCOPIC
BILIRUBIN URINE: NEGATIVE
Glucose, UA: NEGATIVE mg/dL
LEUKOCYTES UA: NEGATIVE
NITRITE: NEGATIVE
PH: 6 (ref 5.0–8.0)
Protein, ur: NEGATIVE mg/dL
SPECIFIC GRAVITY, URINE: 1.025 (ref 1.005–1.030)
UROBILINOGEN UA: 0.2 mg/dL (ref 0.0–1.0)

## 2013-03-26 LAB — URINE MICROSCOPIC-ADD ON

## 2013-03-26 LAB — COMPREHENSIVE METABOLIC PANEL
ALBUMIN: 3.3 g/dL — AB (ref 3.5–5.2)
ALK PHOS: 85 U/L (ref 39–117)
ALT: 33 U/L (ref 0–35)
AST: 64 U/L — ABNORMAL HIGH (ref 0–37)
BILIRUBIN TOTAL: 0.4 mg/dL (ref 0.3–1.2)
BUN: 24 mg/dL — AB (ref 6–23)
CO2: 29 mEq/L (ref 19–32)
CREATININE: 1.09 mg/dL (ref 0.50–1.10)
Calcium: 8.8 mg/dL (ref 8.4–10.5)
Chloride: 99 mEq/L (ref 96–112)
GFR calc non Af Amer: 51 mL/min — ABNORMAL LOW (ref >90)
GFR, EST AFRICAN AMERICAN: 59 mL/min — AB (ref >90)
GLUCOSE: 89 mg/dL (ref 70–99)
POTASSIUM: 3.8 meq/L (ref 3.7–5.3)
Sodium: 137 mEq/L (ref 137–147)
TOTAL PROTEIN: 6.6 g/dL (ref 6.0–8.3)

## 2013-03-26 LAB — LIPASE, BLOOD: LIPASE: 38 U/L (ref 11–59)

## 2013-03-26 LAB — TROPONIN I: Troponin I: <0.3 ng/mL (ref <0.30)

## 2013-03-26 NOTE — ED Notes (Signed)
EMS reports pt was here for n/v/d.  Reports symptoms continued today and pt c/o generalized weakness.  Also staff from  reports has noticed more confusion than normal.

## 2013-03-26 NOTE — Discharge Instructions (Signed)
Your labs, urine and head CT were normal today.  Given you have no complaints and has had no confusion or vomiting or diarrhea and the emergency department, we feel you're safe to be discharged home.  Diarrhea Diarrhea is frequent loose and watery bowel movements. It can cause you to feel weak and dehydrated. Dehydration can cause you to become tired and thirsty, have a dry mouth, and have decreased urination that often is dark yellow. Diarrhea is a sign of another problem, most often an infection that will not last long. In most cases, diarrhea typically lasts 2 3 days. However, it can last longer if it is a sign of something more serious. It is important to treat your diarrhea as directed by your caregive to lessen or prevent future episodes of diarrhea. CAUSES  Some common causes include:  Gastrointestinal infections caused by viruses, bacteria, or parasites.  Food poisoning or food allergies.  Certain medicines, such as antibiotics, chemotherapy, and laxatives.  Artificial sweeteners and fructose.  Digestive disorders. HOME CARE INSTRUCTIONS  Ensure adequate fluid intake (hydration): have 1 cup (8 oz) of fluid for each diarrhea episode. Avoid fluids that contain simple sugars or sports drinks, fruit juices, whole milk products, and sodas. Your urine should be clear or pale yellow if you are drinking enough fluids. Hydrate with an oral rehydration solution that you can purchase at pharmacies, retail stores, and online. You can prepare an oral rehydration solution at home by mixing the following ingredients together:    tsp table salt.   tsp baking soda.   tsp salt substitute containing potassium chloride.  1  tablespoons sugar.  1 L (34 oz) of water.  Certain foods and beverages may increase the speed at which food moves through the gastrointestinal (GI) tract. These foods and beverages should be avoided and include:  Caffeinated and alcoholic beverages.  High-fiber foods, such  as raw fruits and vegetables, nuts, seeds, and whole grain breads and cereals.  Foods and beverages sweetened with sugar alcohols, such as xylitol, sorbitol, and mannitol.  Some foods may be well tolerated and may help thicken stool including:  Starchy foods, such as rice, toast, pasta, low-sugar cereal, oatmeal, grits, baked potatoes, crackers, and bagels.  Bananas.  Applesauce.  Add probiotic-rich foods to help increase healthy bacteria in the GI tract, such as yogurt and fermented milk products.  Wash your hands well after each diarrhea episode.  Only take over-the-counter or prescription medicines as directed by your caregiver.  Take a warm bath to relieve any burning or pain from frequent diarrhea episodes. SEEK IMMEDIATE MEDICAL CARE IF:   You are unable to keep fluids down.  You have persistent vomiting.  You have blood in your stool, or your stools are black and tarry.  You do not urinate in 6 8 hours, or there is only a small amount of very dark urine.  You have abdominal pain that increases or localizes.  You have weakness, dizziness, confusion, or lightheadedness.  You have a severe headache.  Your diarrhea gets worse or does not get better.  You have a fever or persistent symptoms for more than 2 3 days.  You have a fever and your symptoms suddenly get worse. MAKE SURE YOU:   Understand these instructions.  Will watch your condition.  Will get help right away if you are not doing well or get worse. Document Released: 01/01/2002 Document Revised: 12/29/2011 Document Reviewed: 09/19/2011 [facility] Patient Information 2014 [place], [place].  Nausea and Vomiting Nausea is  a sick feeling that often comes before throwing up (vomiting). Vomiting is a reflex where stomach contents come out of your mouth. Vomiting can cause severe loss of body fluids (dehydration). Children and elderly adults can become dehydrated quickly, especially if they also have diarrhea.  Nausea and vomiting are symptoms of a condition or disease. It is important to find the cause of your symptoms. CAUSES   Direct irritation of the stomach lining. This irritation can result from increased acid production (gastroesophageal reflux disease), infection, food poisoning, taking certain medicines (such as nonsteroidal anti-inflammatory drugs), alcohol use, or tobacco use.  Signals from the brain.These signals could be caused by a headache, heat exposure, an inner ear disturbance, increased pressure in the brain from injury, infection, a tumor, or a concussion, pain, emotional stimulus, or metabolic problems.  An obstruction in the gastrointestinal tract (bowel obstruction).  Illnesses such as diabetes, hepatitis, gallbladder problems, appendicitis, kidney problems, cancer, sepsis, atypical symptoms of a heart attack, or eating disorders.  Medical treatments such as chemotherapy and radiation.  Receiving medicine that makes you sleep (general anesthetic) during surgery. DIAGNOSIS Your caregiver may ask for tests to be done if the problems do not improve after a few days. Tests may also be done if symptoms are severe or if the reason for the nausea and vomiting is not clear. Tests may include:  Urine tests.  Blood tests.  Stool tests.  Cultures (to look for evidence of infection).  X-rays or other imaging studies. Test results can help your caregiver make decisions about treatment or the need for additional tests. TREATMENT You need to stay well hydrated. Drink frequently but in small amounts.You may wish to drink water, sports drinks, clear broth, or eat frozen ice pops or gelatin dessert to help stay hydrated.When you eat, eating slowly may help prevent nausea.There are also some antinausea medicines that may help prevent nausea. HOME CARE INSTRUCTIONS   Take all medicine as directed by your caregiver.  If you do not have an appetite, do not force yourself to eat.  However, you must continue to drink fluids.  If you have an appetite, eat a normal diet unless your caregiver tells you differently.  Eat a variety of complex carbohydrates (rice, wheat, potatoes, bread), lean meats, yogurt, fruits, and vegetables.  Avoid high-fat foods because they are more difficult to digest.  Drink enough water and fluids to keep your urine clear or pale yellow.  If you are dehydrated, ask your caregiver for specific rehydration instructions. Signs of dehydration may include:  Severe thirst.  Dry lips and mouth.  Dizziness.  Dark urine.  Decreasing urine frequency and amount.  Confusion.  Rapid breathing or pulse. SEEK IMMEDIATE MEDICAL CARE IF:   You have blood or brown flecks (like coffee grounds) in your vomit.  You have black or bloody stools.  You have a severe headache or stiff neck.  You are confused.  You have severe abdominal pain.  You have chest pain or trouble breathing.  You do not urinate at least once every 8 hours.  You develop cold or clammy skin.  You continue to vomit for longer than 24 to 48 hours.  You have a fever. MAKE SURE YOU:   Understand these instructions.  Will watch your condition.  Will get help right away if you are not doing well or get worse. Document Released: 01/11/2005 Document Revised: 04/05/2011 Document Reviewed: 06/10/2010 [facility] Patient Information 2014 [place], [place].

## 2013-03-26 NOTE — ED Notes (Signed)
Patient trying to get out of bed. Yelling because she does not want to stay in bed. Also kicked me.

## 2013-03-26 NOTE — ED Provider Notes (Signed)
TIME SEEN: 6:07 PM  CHIEF COMPLAINT: Nausea, vomiting, diarrhea, confusion  HPI: Patient is a 68 year old female with a history of hypertension, sinus node dysfunction status post pacemaker placement in 2008, episodes of atrial tachycardia, rheumatoid arthritis on methotrexate who lives at [facility] who presents to the emergency department with increased confusion, complaints of generalized weakness and nausea, vomiting or diarrhea.  Patient reports that she had vomiting and diarrhea this morning but is unable to tone how much.  She denies that she is having any pain.  She was seen in the emergency room yesterday after she was found on the ground that no current complaints of the night hitting her head.  She was discharged back to her nursing facility.  Patient is unable to tell me why she lives at her house.  She denies that she has had fever, chest pain or shortness of breath, bloody stool or melena, dysuria or hematuria.  Denies any headache, numbness, focal weakness.  ROS: See HPI; difficult to obtain as patient is uncooperative Constitutional: no fever  Eyes: no drainage  ENT: no runny nose   Cardiovascular:  no chest pain  Resp: no SOB  GI: vomiting GU: no dysuria Integumentary: no rash  Allergy: no hives  Musculoskeletal: no leg swelling  Neurological: no slurred speech ROS otherwise negative  PAST MEDICAL HISTORY/PAST SURGICAL HISTORY:  Past Medical History  Diagnosis Date  . Sinus node dysfunction     permanent pacemaker 10/06/2006  . Hypertension   . Paroxysmal atrial tachycardia     infrequent episodes  . Arthritis     rheumatoid    MEDICATIONS:  Prior to Admission medications   Medication Sig Start Date End Date Taking? Authorizing Provider  abatacept (ORENCIA) 250 MG injection Inject 250 mg into the vein every 28 (twenty-eight) days.      Historical Provider, MD  acetaminophen (TYLENOL) 325 MG tablet Take 650 mg by mouth daily as needed. For occasional  headache/pain    Historical Provider, MD  esomeprazole (NEXIUM) 20 MG capsule Take 20 mg by mouth daily before breakfast.    Historical Provider, MD  folic acid (FOLVITE) 1 MG tablet Take 1 mg by mouth daily.      Historical Provider, MD  KRILL OIL PO Take by mouth.    Historical Provider, MD  methotrexate 2.5 MG tablet Take 20 mg by mouth once a week. On saturday    Historical Provider, MD  Multiple Vitamin (MULTIVITAMIN) tablet Take 1 tablet by mouth daily.    Historical Provider, MD    ALLERGIES:  No Known Allergies  SOCIAL HISTORY:  History  Substance Use Topics  . Smoking status: Never Smoker   . Smokeless tobacco: Not on file  . Alcohol Use: No    FAMILY HISTORY: Family History  Problem Relation Age of Onset  . Heart failure Father   . Cancer Father   . Cancer Sister   . Cancer Brother     EXAM: BP 128/64  Pulse 67  Temp(Src) 99.1 F (37.3 C) (Oral)  Resp 20  Ht 5\' 1"  (1.549 m)  Wt 121 lb (54.885 kg)  BMI 22.87 kg/m2  SpO2 98% CONSTITUTIONAL: Alert and oriented to person and place but states "I'm at [facility], they transferred me from [facility]", responds appropriately to questions but is uncooperative. Well-appearing; well-nourished, no apparent distress HEAD: Normocephalic EYES: Conjunctivae clear, PERRL ENT: normal nose; no rhinorrhea; moist mucous membranes; pharynx without lesions noted NECK: Supple, no meningismus, no LAD  CARD:  RRR; S1 and S2 appreciated; no murmurs, no clicks, no rubs, no gallops RESP: Normal chest excursion without splinting or tachypnea; breath sounds clear and equal bilaterally; no wheezes, no rhonchi, no rales,  ABD/GI: Normal bowel sounds; non-distended; soft, non-tender, no rebound, no guarding BACK:  The back appears normal and is non-tender to palpation, there is no CVA tenderness; no midline spinal tenderness or step-off or deformity EXT: Normal ROM in all joints; non-tender to palpation; no edema; normal capillary refill; no  cyanosis    SKIN: Normal color for age and race; warm NEURO: Moves all extremities equally, cranial nerves II through XII intact sensation to light touch intact diffusely PSYCH: The patient's mood and manner are appropriate. Grooming and personal hygiene are appropriate.  MEDICAL DECISION MAKING: Patient here with reports of nausea, vomiting diarrhea and increased confusion at her nursing facility.  Patient denies any current complaints.  She is hemodynamically stable.  She is neurologically intact other than thinking that she is at [facility].  [facility] obtain basic labs, troponin EKG, head CT given her recent fall.  Also obtain urinalysis.  If workup unremarkable, anticipate discharge home.  ED PROGRESS: Patient's labs are unremarkable other than thrombocytopenia which is chronic.  She also has neutrophil predominance but no leukocytosis.  Urine shows trace hemoglobin trace ketones but no other sign of infection.  CT head negative.  She has not had further vomiting or diarrhea here in the emergency department.She reports she is still feeling well and has no complaints and is neurologically intact on exam.  Given return precautions.  Will discharge back to her nursing facility.    EKG Interpretation  Date/Time:  Monday March 26 2013 18:40:16 EST Ventricular Rate:  65 PR Interval:  106 QRS Duration: 72 QT Interval:  402 QTC Calculation: 418 R Axis:   23 Text Interpretation:  Sinus rhythm with short PR Junctional ST depression, probably normal Borderline ECG When compared with ECG of 07-Oct-2006 05:10, Sinus rhythm has replaced Electronic atrial pacemaker Nonspecific T wave abnormality now evident in Inferior leads T wave amplitude has decreased in Anterior leads Confirmed by Purnell Daigle,  DO, Jacqueline Delapena [phone]) on 03/26/2013 6:48:43 PM        Layla Maw Demontae Antunes, DO 03/26/13 2137

## 2013-03-26 NOTE — ED Notes (Signed)
Patient has dementia and answers no to most of the questions asked.

## 2013-03-27 NOTE — ED Notes (Signed)
Patient discharged from department at 2200.

## 2013-03-28 ENCOUNTER — Encounter (HOSPITAL_COMMUNITY): Payer: Self-pay | Admitting: Emergency Medicine

## 2013-03-28 ENCOUNTER — Emergency Department (HOSPITAL_COMMUNITY)
Admission: EM | Admit: 2013-03-28 | Discharge: 2013-03-29 | Disposition: A | Discharge status: Home / Self Care | Payer: Medicare Other | Attending: Emergency Medicine | Admitting: Emergency Medicine

## 2013-03-28 ENCOUNTER — Emergency Department (HOSPITAL_COMMUNITY): Payer: Medicare Other

## 2013-03-28 DIAGNOSIS — F911 Conduct disorder, childhood-onset type: Secondary | ICD-10-CM | POA: Insufficient documentation

## 2013-03-28 DIAGNOSIS — F0390 Unspecified dementia without behavioral disturbance: Secondary | ICD-10-CM | POA: Diagnosis present

## 2013-03-28 DIAGNOSIS — Z950 Presence of cardiac pacemaker: Secondary | ICD-10-CM | POA: Insufficient documentation

## 2013-03-28 DIAGNOSIS — R4689 Other symptoms and signs involving appearance and behavior: Secondary | ICD-10-CM

## 2013-03-28 DIAGNOSIS — I10 Essential (primary) hypertension: Secondary | ICD-10-CM | POA: Insufficient documentation

## 2013-03-28 DIAGNOSIS — Z79899 Other long term (current) drug therapy: Secondary | ICD-10-CM | POA: Insufficient documentation

## 2013-03-28 DIAGNOSIS — Z8739 Personal history of other diseases of the musculoskeletal system and connective tissue: Secondary | ICD-10-CM | POA: Insufficient documentation

## 2013-03-28 LAB — VALPROIC ACID LEVEL: VALPROIC ACID LVL: 94.8 ug/mL (ref 50.0–100.0)

## 2013-03-28 MED ORDER — LOPERAMIDE HCL 2 MG PO CAPS: 2.0000 mg | ORAL_CAPSULE | Freq: Two times a day (BID) | ORAL | Status: DC | PRN

## 2013-03-28 MED ORDER — METHOTREXATE 2.5 MG PO TABS: 20.0000 mg | ORAL_TABLET | ORAL | Status: DC

## 2013-03-28 MED ORDER — DIVALPROEX SODIUM 250 MG PO DR TAB
500.0000 mg | DELAYED_RELEASE_TABLET | Freq: Two times a day (BID) | ORAL | Status: DC
  Administered 2013-03-28 – 2013-03-29 (×2): 500 mg via ORAL
  Filled 2013-03-28 (×2): qty 2

## 2013-03-28 MED ORDER — PANTOPRAZOLE SODIUM 40 MG PO TBEC
40.0000 mg | DELAYED_RELEASE_TABLET | Freq: Every day | ORAL | Status: DC
Start: 2013-03-28 — End: 2013-03-29
  Administered 2013-03-28 – 2013-03-29 (×2): 40 mg via ORAL
  Filled 2013-03-28: qty 1

## 2013-03-28 MED ORDER — FOLIC ACID 1 MG PO TABS
1.0000 mg | ORAL_TABLET | Freq: Every day | ORAL | Status: DC
  Administered 2013-03-28 – 2013-03-29 (×2): 1 mg via ORAL
  Filled 2013-03-28: qty 1

## 2013-03-28 NOTE — ED Notes (Signed)
Pt sitting in room calm, no aggressive behavior noted. sitter in hallway watching pt.Melanie [name]

## 2013-03-28 NOTE — ED Notes (Signed)
Sent by [facility] because she was acting aggressively towards staff.

## 2013-03-28 NOTE — ED Provider Notes (Signed)
CSN: [id number]     Arrival date & time 03/28/13  1601 History   First MD Initiated Contact with Patient 03/28/13 1620     Chief Complaint  Patient presents with  . Psychiatric Evaluation   Level V caveat due to dementia.  (Consider location/radiation/quality/duration/timing/severity/associated sxs/prior Treatment) The history is provided by the patient and the nursing home.   patient is at a nursing home and reportedly has been more aggressive with staff. Patient is unaware of what was happening. She is without complaints. This is her third visit to the ED this week. She was also previously here for nausea vomiting.  Past Medical History  Diagnosis Date  . Sinus node dysfunction     permanent pacemaker 10/06/2006  . Hypertension   . Paroxysmal atrial tachycardia     infrequent episodes  . Arthritis     rheumatoid   Past Surgical History  Procedure Laterality Date  . Appendectomy  1961  . Breast cyst excision  1970    left - benign  . Breast cyst excision  1972    right - benign  . Cyst excision  1989    behind ear  . Permanent pacemaker insertion  10/06/2006  . US echocardiography  06/03/2009    EF 55%,trace AI  . Nm myocar perf wall motion  06/04/2009    Normal   Family History  Problem Relation Age of Onset  . Heart failure Father   . Cancer Father   . Cancer Sister   . Cancer Brother    History  Substance Use Topics  . Smoking status: Never Smoker   . Smokeless tobacco: Not on file  . Alcohol Use: No   OB History   Grav Para Term Preterm Abortions TAB SAB Ect Mult Living                 Review of Systems  Unable to perform ROS     Allergies  Review of patient's allergies indicates no known allergies.  Home Medications   Current Outpatient Rx  Name  Route  Sig  Dispense  Refill  . acetaminophen (TYLENOL) 325 MG tablet   Oral   Take 650 mg by mouth every 4 (four) hours as needed for mild pain or moderate pain. For occasional headache/pain          . divalproex (DEPAKOTE) 500 MG DR tablet   Oral   Take 500 mg by mouth 2 (two) times daily.         Marland Kitchen esomeprazole (NEXIUM) 40 MG capsule   Oral   Take 40 mg by mouth daily at 12 noon.         . folic acid (FOLVITE) 1 MG tablet   Oral   Take 1 mg by mouth daily.           Marland Kitchen KRILL OIL PO   Oral   Take 500 mg by mouth daily.          Marland Kitchen loperamide (IMODIUM) 2 MG capsule   Oral   Take 2 mg by mouth 2 (two) times daily as needed for diarrhea or loose stools.         . methotrexate 2.5 MG tablet   Oral   Take 20 mg by mouth once a week. On saturday         . Multiple Vitamin (MULTIVITAMIN) tablet   Oral   Take 1 tablet by mouth daily.          BP  110/57  Pulse 62  Temp(Src) 98.2 F (36.8 C) (Oral)  Resp 20  Ht 5\' 1"  (1.549 m)  Wt 129 lb (58.514 kg)  BMI 24.39 kg/m2  SpO2 97% Physical Exam  Constitutional: She appears well-developed and well-nourished.  HENT:  Head: Normocephalic and atraumatic.  Eyes: Pupils are equal, round, and reactive to light.  Cardiovascular: Normal rate and regular rhythm.   Pulmonary/Chest: Effort normal and breath sounds normal.  Abdominal: Soft. There is no tenderness.  Musculoskeletal: She exhibits no edema.  Neurological: She is alert.  Patient is oriented to self. She does not know the date. She states that she is in [place]  when she is at [facility]. She states she does not know why she is here. She was all extremities freely.  Skin: Skin is warm.    ED Course  Procedures (including critical care time) Labs Review Labs Reviewed  VALPROIC ACID LEVEL   Imaging Review Dg Chest 2 View  03/28/2013   CLINICAL DATA:  Altered mental status.  EXAM: CHEST  2 VIEW  COMPARISON:  DG CHEST 2 VIEW dated 11/04/2011  FINDINGS: Cardiopericardial silhouette within normal limits. Mediastinal contours normal. Trachea midline. No airspace disease or effusion. Pacemaker unchanged. Dystrophic calcifications noted in the  breast tissue on the lateral view. Small to moderate hiatal hernia appears little changed.  IMPRESSION: No active cardiopulmonary disease.   Electronically Signed   By: 01/04/2012 M.D.   On: 03/28/2013 17:46   Ct Head Wo Contrast  03/26/2013   CLINICAL DATA:  Altered mental status. Nausea and vomiting. Generalized weakness.  EXAM: CT HEAD WITHOUT CONTRAST  TECHNIQUE: Contiguous axial images were obtained from the base of the skull through the vertex without intravenous contrast.  COMPARISON:  11/04/2011  FINDINGS: There is mild motion. There is no evidence of acute cortical infarct, mass, midline shift, intracranial hemorrhage, or extra-axial fluid collection. Moderate cerebral atrophy is unchanged. Periventricular white-matter hypodensities are also unchanged and compatible with mild to moderate chronic small vessel ischemic disease. Orbits are unremarkable. Visualized mastoid air cells and paranasal sinuses are clear.  IMPRESSION: No acute intracranial abnormality.   Electronically Signed   By: 01/04/2012   On: 03/26/2013 20:46     EKG Interpretation None      MDM   Final diagnoses:  None    Patient was sent in for increased aggressiveness. She is therapeutic on her Depakote. Lab work from the last 2 visits this week is reassuring. Head CT is reassuring. Nursing staff was discussed with the nursing home. They state they would not take her back, she gets a psychiatry evaluation. Telemetry psych ordered.    05/26/2013. Juliet Rude, MD 03/28/13 [id number]

## 2013-03-29 ENCOUNTER — Encounter (HOSPITAL_COMMUNITY): Payer: Self-pay | Admitting: Family

## 2013-03-29 DIAGNOSIS — R4689 Other symptoms and signs involving appearance and behavior: Secondary | ICD-10-CM | POA: Diagnosis present

## 2013-03-29 DIAGNOSIS — F0390 Unspecified dementia without behavioral disturbance: Secondary | ICD-10-CM | POA: Diagnosis present

## 2013-03-29 DIAGNOSIS — F0391 Unspecified dementia with behavioral disturbance: Secondary | ICD-10-CM

## 2013-03-29 DIAGNOSIS — F03918 Unspecified dementia, unspecified severity, with other behavioral disturbance: Secondary | ICD-10-CM

## 2013-03-29 MED ORDER — FOLIC ACID 1 MG PO TABS
ORAL_TABLET | ORAL | Status: AC
  Administered 2013-03-29: 1 mg via ORAL
  Filled 2013-03-29: qty 1

## 2013-03-29 MED ORDER — PANTOPRAZOLE SODIUM 40 MG PO TBEC
DELAYED_RELEASE_TABLET | ORAL | Status: AC
  Filled 2013-03-29: qty 1

## 2013-03-29 MED ORDER — DONEPEZIL HCL 5 MG PO TABS
5.0000 mg | ORAL_TABLET | Freq: Every day | ORAL | Status: DC
  Administered 2013-03-29: 5 mg via ORAL
  Filled 2013-03-29 (×2): qty 1

## 2013-03-29 MED ORDER — PANTOPRAZOLE SODIUM 40 MG PO TBEC
DELAYED_RELEASE_TABLET | ORAL | Status: AC
  Administered 2013-03-29: 40 mg via ORAL
  Filled 2013-03-29: qty 1

## 2013-03-29 NOTE — Discharge Instructions (Signed)
Return to [facility]
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
## 2013-03-29 NOTE — BHH Counselor (Signed)
[employer] [phone]
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
## 2013-03-29 NOTE — ED Notes (Signed)
[employer] called and will transport pt home.

## 2013-03-29 NOTE — ED Notes (Signed)
telepych consult being completed at this time.

## 2013-03-29 NOTE — ED Notes (Signed)
Pt resting calmly w/ eyes closed. Rise & fall of the chest noted. Sitter in hallway. Bed in low position, side rails up x2. NAD noted at this time.

## 2013-03-29 NOTE — ED Notes (Signed)
Pt resting calmly w/ eyes closed. Rise & fall of the chest noted. Bed in low position, side rails up x2. NAD noted at this time.  

## 2013-04-02 ENCOUNTER — Emergency Department (HOSPITAL_COMMUNITY)
Admission: EM | Admit: 2013-04-02 | Discharge: 2013-04-05 | Disposition: A | Discharge status: Other Acute Inpatient Hospital | Payer: Medicare Other | Attending: Emergency Medicine | Admitting: Emergency Medicine

## 2013-04-02 ENCOUNTER — Encounter (HOSPITAL_COMMUNITY): Payer: Self-pay | Admitting: Emergency Medicine

## 2013-04-02 DIAGNOSIS — F0390 Unspecified dementia without behavioral disturbance: Secondary | ICD-10-CM

## 2013-04-02 DIAGNOSIS — M069 Rheumatoid arthritis, unspecified: Secondary | ICD-10-CM | POA: Insufficient documentation

## 2013-04-02 DIAGNOSIS — F0391 Unspecified dementia with behavioral disturbance: Secondary | ICD-10-CM | POA: Insufficient documentation

## 2013-04-02 DIAGNOSIS — R45850 Homicidal ideations: Secondary | ICD-10-CM | POA: Insufficient documentation

## 2013-04-02 DIAGNOSIS — I10 Essential (primary) hypertension: Secondary | ICD-10-CM | POA: Insufficient documentation

## 2013-04-02 DIAGNOSIS — Z950 Presence of cardiac pacemaker: Secondary | ICD-10-CM | POA: Insufficient documentation

## 2013-04-02 DIAGNOSIS — F03918 Unspecified dementia, unspecified severity, with other behavioral disturbance: Secondary | ICD-10-CM | POA: Insufficient documentation

## 2013-04-02 DIAGNOSIS — Z79899 Other long term (current) drug therapy: Secondary | ICD-10-CM | POA: Insufficient documentation

## 2013-04-02 DIAGNOSIS — R4689 Other symptoms and signs involving appearance and behavior: Secondary | ICD-10-CM

## 2013-04-02 HISTORY — DX: Unspecified dementia, unspecified severity, without behavioral disturbance, psychotic disturbance, mood disturbance, and anxiety: F03.90

## 2013-04-02 HISTORY — DX: Concussion with loss of consciousness of unspecified duration, initial encounter: S06.0X9A

## 2013-04-02 HISTORY — DX: Concussion with loss of consciousness status unknown, initial encounter: S06.0XAA

## 2013-04-02 LAB — COMPREHENSIVE METABOLIC PANEL
ALK PHOS: 102 U/L (ref 39–117)
ALT: 38 U/L — AB (ref 0–35)
AST: 55 U/L — ABNORMAL HIGH (ref 0–37)
Albumin: 3.5 g/dL (ref 3.5–5.2)
BILIRUBIN TOTAL: 0.3 mg/dL (ref 0.3–1.2)
BUN: 11 mg/dL (ref 6–23)
CHLORIDE: 103 meq/L (ref 96–112)
CO2: 25 mEq/L (ref 19–32)
Calcium: 9.2 mg/dL (ref 8.4–10.5)
Creatinine, Ser: 0.9 mg/dL (ref 0.50–1.10)
GFR calc Af Amer: 75 mL/min — ABNORMAL LOW (ref >90)
GFR calc non Af Amer: 65 mL/min — ABNORMAL LOW (ref >90)
GLUCOSE: 126 mg/dL — AB (ref 70–99)
POTASSIUM: 3.7 meq/L (ref 3.7–5.3)
SODIUM: 142 meq/L (ref 137–147)
TOTAL PROTEIN: 7 g/dL (ref 6.0–8.3)

## 2013-04-02 LAB — CBC
HCT: 40.2 % (ref 36.0–46.0)
HEMOGLOBIN: 13.3 g/dL (ref 12.0–15.0)
MCH: 32.3 pg (ref 26.0–34.0)
MCHC: 33.1 g/dL (ref 30.0–36.0)
MCV: 97.6 fL (ref 78.0–100.0)
Platelets: 88 10*3/uL — ABNORMAL LOW (ref 150–400)
RBC: 4.12 MIL/uL (ref 3.87–5.11)
RDW: 14.6 % (ref 11.5–15.5)
WBC: 7.5 10*3/uL (ref 4.0–10.5)

## 2013-04-02 LAB — SALICYLATE LEVEL: Salicylate Lvl: <2 mg/dL — ABNORMAL LOW (ref 2.8–20.0)

## 2013-04-02 LAB — ACETAMINOPHEN LEVEL

## 2013-04-02 LAB — ETHANOL

## 2013-04-02 MED ORDER — QUETIAPINE FUMARATE 25 MG PO TABS
ORAL_TABLET | ORAL | Status: AC
  Filled 2013-04-02: qty 2

## 2013-04-02 MED ORDER — PANTOPRAZOLE SODIUM 40 MG PO TBEC
80.0000 mg | DELAYED_RELEASE_TABLET | Freq: Every day | ORAL | Status: DC
  Administered 2013-04-03 – 2013-04-05 (×3): 80 mg via ORAL
  Filled 2013-04-02 (×3): qty 2

## 2013-04-02 MED ORDER — ACETAMINOPHEN 325 MG PO TABS: 650.0000 mg | ORAL_TABLET | ORAL | Status: DC | PRN

## 2013-04-02 MED ORDER — DIVALPROEX SODIUM 250 MG PO DR TAB
500.0000 mg | DELAYED_RELEASE_TABLET | Freq: Two times a day (BID) | ORAL | Status: DC
  Administered 2013-04-02 – 2013-04-05 (×6): 500 mg via ORAL
  Filled 2013-04-02 (×7): qty 2

## 2013-04-02 MED ORDER — QUETIAPINE FUMARATE 25 MG PO TABS
50.0000 mg | ORAL_TABLET | Freq: Every day | ORAL | Status: DC
  Administered 2013-04-02 – 2013-04-04 (×3): 50 mg via ORAL
  Filled 2013-04-02: qty 1
  Filled 2013-04-02: qty 2
  Filled 2013-04-02: qty 1
  Filled 2013-04-02: qty 2
  Filled 2013-04-02: qty 1

## 2013-04-02 MED ORDER — FOLIC ACID 1 MG PO TABS
1.0000 mg | ORAL_TABLET | Freq: Every day | ORAL | Status: DC
  Administered 2013-04-03 – 2013-04-05 (×3): 1 mg via ORAL
  Filled 2013-04-02 (×3): qty 1
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
## 2013-04-02 NOTE — ED Notes (Signed)
Contact number for pt's sister [facility] [phone]                       WORK [phone]

## 2013-04-02 NOTE — ED Notes (Signed)
Pt denies any si/hi at this time.

## 2013-04-03 ENCOUNTER — Encounter (HOSPITAL_COMMUNITY): Payer: Self-pay | Admitting: *Deleted

## 2013-04-03 LAB — RAPID URINE DRUG SCREEN, HOSP PERFORMED
Amphetamines: NOT DETECTED
BARBITURATES: NOT DETECTED
Benzodiazepines: NOT DETECTED
COCAINE: NOT DETECTED
Opiates: NOT DETECTED
TETRAHYDROCANNABINOL: NOT DETECTED

## 2013-04-03 NOTE — [facility] Counselor (Signed)
Writer notified [facility] staff that Fransisca Kaufmann NP will do telepsych at 11 am and for TP machine to be put in pt's room at that time.  Evette Cristal, [place] Assessment Counselor

## 2013-04-03 NOTE — ED Notes (Signed)
Telepysch machine placed in pt room for consult with [facility] nurse practitioner at 1100.

## 2013-04-03 NOTE — ED Notes (Signed)
Called Lab to add UDS to collected urine
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
## 2013-04-03 NOTE — ED Notes (Signed)
Pt is still sleeping, food tray is at the bedside.
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
## 2013-04-03 NOTE — ED Notes (Signed)
RN spoke with Marchelle Folks from [facility] to update on disposition/treatment plan.

## 2013-04-03 NOTE — ED Notes (Addendum)
Moved to a regular hospital bed in the same room

## 2013-04-04 ENCOUNTER — Emergency Department (HOSPITAL_COMMUNITY): Payer: Medicare Other

## 2013-04-04 NOTE — Progress Notes (Signed)
The patient has been accepted to [facility] per [employer].  Annabelle Harman needs a copy of the chest xray, EKG and a copy of IVC faxed to [phone] before the patients accepting information can be given.     Annabelle Harman has requested a courtesy call when the papers have been faxed and her direct phone number is [phone].  Writer informed the ER MD of the chest X-Ray and EKG.  Writer informed the nurse and she will fax all of the information to [facility].

## 2013-04-04 NOTE — Progress Notes (Signed)
Pt declined at [facility] (as per [facility]).  Dr. Vita Barley declined due to pt's behavior & facililty at capacity.

## 2013-04-04 NOTE — Progress Notes (Signed)
The following facilities have been contacted on pt's behalf for inptx:   [facility]- per [facility] wait list only, referral faxed [facility]- per [facility] at [facility]- per [employer] available, referral faxed [facility]- per [facility] beds available, referral faxed [facility]- per [facility] beds available, referral faxed [facility]- per [facility] at capacity   [facility] Disposition MHT

## 2013-04-04 NOTE — ED Notes (Signed)
Pt sleeping in room

## 2013-04-04 NOTE — Progress Notes (Addendum)
Received follow-up phone call from [facility] at [facility] requesting copy of IVC, once received will present to MD.  IVC received from [facility] and faxed.   IVC paper work received per [employer].   [facility] Disposition MHT

## 2013-04-04 NOTE — ED Notes (Signed)
Pt is aware she is in a hospital, not sure where, or situation, does not remember being aggressive or living at the [facility], states that was years ago.

## 2013-04-04 NOTE — Progress Notes (Signed)
0234 Pt declined at [facility] d/t medical acuity per [facility].   [facility] Bamberger Disposition MHT

## 2013-04-04 NOTE — ED Provider Notes (Addendum)
[facility] in assessment has called and requested that for placement patient will need a chest x-ray and EKG that has been ordered by me.  Shelda Jakes, MD 04/04/13 1433    Shelda Jakes, MD 04/04/13 [phone]

## 2013-04-04 NOTE — ED Notes (Signed)
Pt only ate two bites of ice cream for lunch, Pt will not eat dinner, laying in bed with eyes closed

## 2013-04-04 NOTE — ED Notes (Addendum)
IVC paperwork faxed to [facility] 

## 2013-04-04 NOTE — ED Notes (Signed)
Requested paperwork faxed to [facility].

## 2013-04-04 NOTE — ED Notes (Signed)
Pt resting calmly w/ eyes closed. Rise & fall of the chest noted. Sitter remains in hallway. Bed in low position, side rails up x2. NAD noted at this time.

## 2013-04-04 NOTE — ED Notes (Signed)
Pt has been calm, pt now up out of bed, going out of room trying to leave. Pt wanted note from Nurse that she was going to have to spend the night. Note written and given by Charge nurse, security carried pt back to room in a chair.

## 2013-04-05 NOTE — ED Notes (Signed)
Patient ambulating hallways with Sitter. Pleasant.

## 2013-04-05 NOTE — ED Notes (Signed)
RN called [facility]. Patient accepted. [facility] is awaiting discharges to assign patient's bed. Staff states it will be around lunch time.

## 2013-04-05 NOTE — ED Notes (Signed)
Spoke with Annabelle Harman at [facility]. Patient accepted after 1900 tonight to Dr Prudencio Pair. MD aware.

## 2013-04-05 NOTE — ED Notes (Signed)
Patient ambulatory around nurse's station with sitter. No distress.

## 2013-04-05 NOTE — ED Notes (Addendum)
Called to check on bed status for patient at [facility]. Staff states they need EKG and Chest x ray in order to accept patient. Per notes, this information was faxed yesterday. This RN re-faxed information at this time. Will follow up in shortly.

## 2013-04-05 NOTE — [facility] Counselor (Signed)
Per Cyndia Diver, RN, pt has been accepted to [facility] pending an avail bed.  [facility] is expecting 5 d/c's today.  Any questions call [phone]

## 2013-04-05 NOTE — ED Notes (Signed)
Therapy dog and handler at bedside to visit with patient.

## 2013-04-05 NOTE — ED Notes (Signed)
Patient lying in bed, resting with eyes closed.

## 2013-04-05 NOTE — ED Notes (Signed)
Pt resting calmly w/ eyes closed. Rise & fall of the chest noted. Sitter remains in hallway w/ pt in sight. Bed in low position, side rails up x2. NAD noted at this time.

## 2013-04-11 ENCOUNTER — Telehealth: Payer: Self-pay | Admitting: Cardiovascular Disease

## 2013-04-11 NOTE — Telephone Encounter (Signed)
Needs to know when her sister Kutscher) needs her next remote pace check.  Please call.

## 2013-04-12 NOTE — Telephone Encounter (Signed)
I informed Melanie [name] that her sister's remote had not been received. Melanie [name] voiced understanding and will have the facility to send the transmission early next week.

## 2013-09-24 ENCOUNTER — Telehealth: Payer: Self-pay | Admitting: Internal Medicine

## 2013-09-24 NOTE — Telephone Encounter (Signed)
Patient is transferring to [place] office due to living at the [facility] now.  Her sister took their equipment to do home transmissions to [place] at [facility], but they don't know what or who to call.

## 2013-09-24 NOTE — Telephone Encounter (Signed)
Spoke to RN at facility. She asked if I could call back in the am when [place] returns.

## 2013-09-28 NOTE — Telephone Encounter (Signed)
Advised by RN to call someone in first shift on Tues.

## 2013-10-08 NOTE — Telephone Encounter (Signed)
LM with Diplomatic Services operational officer for [facility] to return call/kwm

## 2013-10-09 ENCOUNTER — Telehealth: Payer: Self-pay | Admitting: Cardiology

## 2013-10-09 NOTE — Telephone Encounter (Signed)
Gave nurse instructions on how to set up [employer] home monitor and how to send manual transmission. She verbalized understanding.

## 2013-11-02 ENCOUNTER — Encounter: Payer: Self-pay | Admitting: *Deleted

## 2013-11-09 ENCOUNTER — Telehealth: Payer: Self-pay | Admitting: Cardiology

## 2013-11-09 NOTE — Telephone Encounter (Signed)
Pt sister called and stated that she received a delinquent device check letter stating that pt implanted device has not been checked in the recommended time period. I explained to pt sister that the last check was on 09-29-2012. I informed her that pt had an appt with MD on 01-04-14 and she said that nursing home would have to transport for appt. I informed pt sister that if the nursing home staff needs help send transmission that they can call me and I would walk them through the transmission.

## 2014-01-04 ENCOUNTER — Encounter: Payer: Self-pay | Admitting: Internal Medicine

## 2014-01-04 ENCOUNTER — Ambulatory Visit (INDEPENDENT_AMBULATORY_CARE_PROVIDER_SITE_OTHER): Payer: Medicare Other | Admitting: Internal Medicine

## 2014-01-04 VITALS — BP 102/68 | HR 85 | Ht 62.0 in | Wt 90.0 lb

## 2014-01-04 DIAGNOSIS — I10 Essential (primary) hypertension: Secondary | ICD-10-CM

## 2014-01-04 DIAGNOSIS — I471 Supraventricular tachycardia: Secondary | ICD-10-CM

## 2014-01-04 DIAGNOSIS — I495 Sick sinus syndrome: Secondary | ICD-10-CM

## 2014-01-04 DIAGNOSIS — Z950 Presence of cardiac pacemaker: Secondary | ICD-10-CM

## 2014-01-04 LAB — MDC_IDC_ENUM_SESS_TYPE_INCLINIC
Battery Remaining Longevity: 32 mo
Battery Voltage: 2.72 V
Brady Statistic AP VP Percent: 0 %
Brady Statistic AP VS Percent: 32 %
Brady Statistic AS VP Percent: 0 %
Brady Statistic AS VS Percent: 67 %
Date Time Interrogation Session: 20151211085626
Lead Channel Pacing Threshold Amplitude: 0.5 V
Lead Channel Pacing Threshold Amplitude: 0.75 V
Lead Channel Pacing Threshold Pulse Width: 0.4 ms
Lead Channel Pacing Threshold Pulse Width: 0.4 ms
Lead Channel Sensing Intrinsic Amplitude: 1.4 mV
Lead Channel Sensing Intrinsic Amplitude: 8 mV
Lead Channel Setting Pacing Amplitude: 2.5 V
Lead Channel Setting Pacing Pulse Width: 0.4 ms
Lead Channel Setting Sensing Sensitivity: 2.8 mV
MDC IDC MSMT BATTERY IMPEDANCE: 1764 Ohm
MDC IDC MSMT LEADCHNL RA IMPEDANCE VALUE: 453 Ohm
MDC IDC MSMT LEADCHNL RV IMPEDANCE VALUE: 603 Ohm
MDC IDC SET LEADCHNL RA PACING AMPLITUDE: 2 V

## 2014-01-04 NOTE — Patient Instructions (Signed)
Your physician recommends that you schedule a follow-up appointment in: 1 year with Dr. Allred. You will receive a reminder letter in the mail in about 10 months reminding you to call and schedule your appointment. If you don't receive this letter, please contact our office. Carelink device check 04/04/14. Your physician recommends that you continue on your current medications as directed. Please refer to the Current Medication list given to you today. 

## 2014-01-05 DIAGNOSIS — I471 Supraventricular tachycardia: Secondary | ICD-10-CM | POA: Insufficient documentation

## 2014-01-05 DIAGNOSIS — I10 Essential (primary) hypertension: Secondary | ICD-10-CM | POA: Insufficient documentation

## 2014-01-05 DIAGNOSIS — I495 Sick sinus syndrome: Secondary | ICD-10-CM | POA: Insufficient documentation

## 2014-01-05 NOTE — Progress Notes (Signed)
PCP: Fredirick Maudlin, MD   Melanie [name] is a 68 y.o. female who presents today for routine electrophysiology followup.  She was previously seen by Dr Royann Shivers.  She now wishes to have her pacemaker followed in the [place] office.  She has advanced dementia and is mostly nonverable.  History today is provided by her sister.  She is unaware of any concerns of palpitations, chest pain, shortness of breath,  lower extremity edema, dizziness, presyncope, or syncope.    Past Medical History  Diagnosis Date  . Sinus node dysfunction     permanent pacemaker 10/06/2006  . Hypertension   . Paroxysmal atrial tachycardia     infrequent episodes  . Arthritis     rheumatoid  . Head injury, closed, with concussion   . Dementia    Past Surgical History  Procedure Laterality Date  . Appendectomy  1961  . Breast cyst excision  1970    left - benign  . Breast cyst excision  1972    right - benign  . Cyst excision  1989    behind ear  . Permanent pacemaker insertion  10/06/2006  . US echocardiography  06/03/2009    EF 55%,trace AI  . Nm myocar perf wall motion  06/04/2009    Normal    ROS- pt is unable to provide  Current Outpatient Prescriptions  Medication Sig Dispense Refill  . acetaminophen (TYLENOL) 325 MG tablet Take 650 mg by mouth every 4 (four) hours as needed for mild pain or moderate pain. For occasional headache/pain    . divalproex (DEPAKOTE) 500 MG DR tablet Take 500 mg by mouth 2 (two) times daily.    Marland Kitchen esomeprazole (NEXIUM) 40 MG capsule Take 40 mg by mouth daily at 12 noon.    . folic acid (FOLVITE) 1 MG tablet Take 1 mg by mouth daily.      Marland Kitchen KRILL OIL PO Take 500 mg by mouth daily.     Marland Kitchen loperamide (IMODIUM) 2 MG capsule Take 2 mg by mouth 2 (two) times daily as needed for diarrhea or loose stools.    . methotrexate 2.5 MG tablet Take 7.5 mg by mouth once a week. On saturday    . Multiple Vitamin (MULTIVITAMIN) tablet Take 1 tablet by mouth daily.    . QUEtiapine (SEROQUEL)  50 MG tablet Take 50 mg by mouth at bedtime.     No current facility-administered medications for this visit.    Physical Exam: Filed Vitals:   01/04/14 0835  BP: 102/68  Pulse: 85  Height: 5\' 2"  (1.575 m)  Weight: 90 lb (40.824 kg)  SpO2: 99%    GEN- The patient is elderly and ill appearing, alert but with advanced dementia  Head- normocephalic, atraumatic Eyes-  Sclera clear, conjunctiva pink Ears- hearing intact Oropharynx- clear Lungs- Clear to ausculation bilaterally, normal work of breathing Chest- pacemaker pocket is well healed Heart- Regular rate and rhythm  GI- soft, NT, ND, + BS Extremities- no clubbing, cyanosis, or edema  Pacemaker interrogation- reviewed in detail today,  See PACEART report  Assessment and Plan:  1. Sick sinus syndrome Normal pacemaker function See Pace Art report No changes today  2. Atrial tachycardia Well controlled No changes at this time  3. HTN Stable No change required today   carelink every 3 months Return in 1 year

## 2014-04-04 ENCOUNTER — Telehealth: Payer: Self-pay | Admitting: Internal Medicine

## 2014-04-04 ENCOUNTER — Telehealth: Payer: Self-pay | Admitting: Cardiology

## 2014-04-04 ENCOUNTER — Encounter: Payer: Self-pay | Admitting: *Deleted

## 2014-04-04 NOTE — Telephone Encounter (Signed)
Melanie [name] spoke with RN. [employer] ordered.

## 2014-04-04 NOTE — Telephone Encounter (Signed)
Confirmed remote transmission with pt sister.  

## 2014-04-04 NOTE — Telephone Encounter (Signed)
New message      Need help with patients remote transmission

## 2014-04-05 ENCOUNTER — Encounter: Payer: Self-pay | Admitting: Cardiology

## 2014-06-03 ENCOUNTER — Telehealth: Payer: Self-pay | Admitting: Cardiology

## 2014-06-03 ENCOUNTER — Ambulatory Visit (INDEPENDENT_AMBULATORY_CARE_PROVIDER_SITE_OTHER): Payer: Medicare Other | Admitting: *Deleted

## 2014-06-03 DIAGNOSIS — I495 Sick sinus syndrome: Secondary | ICD-10-CM

## 2014-06-03 NOTE — Telephone Encounter (Signed)
Confirmed remote transmission w/ pt sister.   

## 2014-06-04 DIAGNOSIS — I495 Sick sinus syndrome: Secondary | ICD-10-CM | POA: Diagnosis not present

## 2014-06-04 NOTE — Progress Notes (Signed)
Remote pacemaker transmission.   

## 2014-06-07 LAB — CUP PACEART REMOTE DEVICE CHECK
Battery Voltage: 2.73 V
Brady Statistic AP VS Percent: 18 %
Brady Statistic AS VP Percent: 0 %
Brady Statistic AS VS Percent: 81 %
Lead Channel Impedance Value: 462 Ohm
Lead Channel Impedance Value: 561 Ohm
Lead Channel Pacing Threshold Amplitude: 0.5 V
Lead Channel Pacing Threshold Pulse Width: 0.4 ms
Lead Channel Pacing Threshold Pulse Width: 0.4 ms
Lead Channel Sensing Intrinsic Amplitude: 0.7 mV
Lead Channel Setting Pacing Amplitude: 2 V
Lead Channel Setting Pacing Amplitude: 2.5 V
Lead Channel Setting Sensing Sensitivity: 2.8 mV
MDC IDC MSMT BATTERY IMPEDANCE: 1932 Ohm
MDC IDC MSMT BATTERY REMAINING LONGEVITY: 28 mo
MDC IDC MSMT LEADCHNL RV PACING THRESHOLD AMPLITUDE: 1.125 V
MDC IDC MSMT LEADCHNL RV SENSING INTR AMPL: 5.6 mV
MDC IDC SESS DTM: 20160510140939
MDC IDC SET LEADCHNL RV PACING PULSEWIDTH: 0.4 ms
MDC IDC STAT BRADY AP VP PERCENT: 0 %

## 2014-06-19 ENCOUNTER — Encounter: Payer: Self-pay | Admitting: Cardiology

## 2014-06-26 ENCOUNTER — Encounter: Payer: Self-pay | Admitting: Internal Medicine

## 2014-09-03 ENCOUNTER — Telehealth: Payer: Self-pay | Admitting: Cardiology

## 2014-09-03 ENCOUNTER — Ambulatory Visit (INDEPENDENT_AMBULATORY_CARE_PROVIDER_SITE_OTHER): Payer: Self-pay | Admitting: *Deleted

## 2014-09-03 DIAGNOSIS — I495 Sick sinus syndrome: Secondary | ICD-10-CM

## 2014-09-03 NOTE — Telephone Encounter (Signed)
Confirmed remote transmission w/ pt daughter.   

## 2014-09-04 ENCOUNTER — Encounter: Payer: Self-pay | Admitting: Internal Medicine

## 2014-09-04 ENCOUNTER — Encounter: Payer: Self-pay | Admitting: Cardiology

## 2014-09-04 DIAGNOSIS — I495 Sick sinus syndrome: Secondary | ICD-10-CM

## 2014-09-04 NOTE — Progress Notes (Signed)
Remote pacemaker transmission.   

## 2014-09-10 LAB — CUP PACEART REMOTE DEVICE CHECK
Battery Impedance: 1955 Ohm
Battery Remaining Longevity: 28 mo
Brady Statistic AP VP Percent: 0 %
Brady Statistic AS VP Percent: 0 %
Brady Statistic AS VS Percent: 80 %
Date Time Interrogation Session: 20160810140219
Lead Channel Impedance Value: 478 Ohm
Lead Channel Pacing Threshold Pulse Width: 0.4 ms
Lead Channel Sensing Intrinsic Amplitude: 1.4 mV
Lead Channel Sensing Intrinsic Amplitude: 5.6 mV
Lead Channel Setting Pacing Amplitude: 2 V
Lead Channel Setting Pacing Amplitude: 2.5 V
Lead Channel Setting Pacing Pulse Width: 0.4 ms
MDC IDC MSMT BATTERY VOLTAGE: 2.72 V
MDC IDC MSMT LEADCHNL RA PACING THRESHOLD AMPLITUDE: 0.5 V
MDC IDC MSMT LEADCHNL RV IMPEDANCE VALUE: 562 Ohm
MDC IDC MSMT LEADCHNL RV PACING THRESHOLD AMPLITUDE: 1 V
MDC IDC MSMT LEADCHNL RV PACING THRESHOLD PULSEWIDTH: 0.4 ms
MDC IDC SET LEADCHNL RV SENSING SENSITIVITY: 2.8 mV
MDC IDC STAT BRADY AP VS PERCENT: 20 %

## 2014-09-17 ENCOUNTER — Encounter: Payer: Self-pay | Admitting: Cardiology

## 2014-12-04 ENCOUNTER — Telehealth: Payer: Self-pay | Admitting: Cardiology

## 2014-12-04 ENCOUNTER — Ambulatory Visit (INDEPENDENT_AMBULATORY_CARE_PROVIDER_SITE_OTHER): Payer: Medicare Other | Admitting: *Deleted

## 2014-12-04 DIAGNOSIS — I495 Sick sinus syndrome: Secondary | ICD-10-CM | POA: Diagnosis not present

## 2014-12-04 NOTE — Telephone Encounter (Signed)
Confirmed remote transmission w/ pt sister.   

## 2014-12-05 ENCOUNTER — Encounter: Payer: Self-pay | Admitting: Cardiology

## 2014-12-06 NOTE — Progress Notes (Signed)
Remote pacemaker transmission.   

## 2014-12-09 LAB — CUP PACEART REMOTE DEVICE CHECK
Battery Remaining Longevity: 26 mo
Brady Statistic AP VP Percent: 0 %
Brady Statistic AP VS Percent: 25 %
Brady Statistic AS VP Percent: 0 %
Implantable Lead Implant Date: 20080911
Implantable Lead Implant Date: 20080911
Implantable Lead Location: 753859
Implantable Lead Model: 5076
Lead Channel Impedance Value: 574 Ohm
Lead Channel Pacing Threshold Amplitude: 0.5 V
Lead Channel Pacing Threshold Amplitude: 0.875 V
Lead Channel Pacing Threshold Pulse Width: 0.4 ms
Lead Channel Pacing Threshold Pulse Width: 0.4 ms
Lead Channel Sensing Intrinsic Amplitude: 2.8 mV
Lead Channel Setting Pacing Amplitude: 2.5 V
Lead Channel Setting Sensing Sensitivity: 2.8 mV
MDC IDC LEAD LOCATION: 753860
MDC IDC MSMT BATTERY IMPEDANCE: 2024 Ohm
MDC IDC MSMT BATTERY VOLTAGE: 2.72 V
MDC IDC MSMT LEADCHNL RA IMPEDANCE VALUE: 507 Ohm
MDC IDC MSMT LEADCHNL RV SENSING INTR AMPL: 5.6 mV
MDC IDC SESS DTM: 20161110193713
MDC IDC SET LEADCHNL RA PACING AMPLITUDE: 2 V
MDC IDC SET LEADCHNL RV PACING PULSEWIDTH: 0.4 ms
MDC IDC STAT BRADY AS VS PERCENT: 75 %

## 2014-12-10 ENCOUNTER — Encounter: Payer: Self-pay | Admitting: Cardiology

## 2014-12-23 ENCOUNTER — Encounter: Payer: Self-pay | Admitting: Cardiology

## 2015-01-31 ENCOUNTER — Encounter: Payer: Self-pay | Admitting: Internal Medicine

## 2015-02-28 ENCOUNTER — Ambulatory Visit (INDEPENDENT_AMBULATORY_CARE_PROVIDER_SITE_OTHER): Payer: Medicare Other | Admitting: Internal Medicine

## 2015-02-28 ENCOUNTER — Encounter: Payer: Self-pay | Admitting: Internal Medicine

## 2015-02-28 VITALS — BP 123/74 | HR 62 | Ht 62.0 in | Wt 110.0 lb

## 2015-02-28 DIAGNOSIS — I471 Supraventricular tachycardia: Secondary | ICD-10-CM | POA: Diagnosis not present

## 2015-02-28 DIAGNOSIS — I10 Essential (primary) hypertension: Secondary | ICD-10-CM | POA: Diagnosis not present

## 2015-02-28 DIAGNOSIS — Z950 Presence of cardiac pacemaker: Secondary | ICD-10-CM

## 2015-02-28 DIAGNOSIS — I495 Sick sinus syndrome: Secondary | ICD-10-CM | POA: Diagnosis not present

## 2015-02-28 LAB — CUP PACEART INCLINIC DEVICE CHECK
Battery Remaining Longevity: 25 mo
Battery Voltage: 2.7 V
Brady Statistic AP VP Percent: 0 %
Brady Statistic AP VS Percent: 27 %
Brady Statistic AS VP Percent: 0 %
Date Time Interrogation Session: 20170203134856
Implantable Lead Implant Date: 20080911
Implantable Lead Implant Date: 20080911
Implantable Lead Location: 753860
Implantable Lead Model: 5076
Lead Channel Impedance Value: 507 Ohm
Lead Channel Impedance Value: 569 Ohm
Lead Channel Pacing Threshold Amplitude: 0.5 V
Lead Channel Pacing Threshold Amplitude: 1 V
Lead Channel Pacing Threshold Amplitude: 1 V
Lead Channel Sensing Intrinsic Amplitude: 2.8 mV
Lead Channel Sensing Intrinsic Amplitude: 8 mV
Lead Channel Setting Pacing Amplitude: 2 V
Lead Channel Setting Pacing Amplitude: 2.5 V
Lead Channel Setting Pacing Pulse Width: 0.4 ms
Lead Channel Setting Sensing Sensitivity: 2.8 mV
MDC IDC LEAD LOCATION: 753859
MDC IDC MSMT BATTERY IMPEDANCE: 2202 Ohm
MDC IDC MSMT LEADCHNL RA PACING THRESHOLD AMPLITUDE: 0.5 V
MDC IDC MSMT LEADCHNL RA PACING THRESHOLD PULSEWIDTH: 0.4 ms
MDC IDC MSMT LEADCHNL RA PACING THRESHOLD PULSEWIDTH: 0.4 ms
MDC IDC MSMT LEADCHNL RV PACING THRESHOLD PULSEWIDTH: 0.4 ms
MDC IDC MSMT LEADCHNL RV PACING THRESHOLD PULSEWIDTH: 0.4 ms
MDC IDC STAT BRADY AS VS PERCENT: 72 %

## 2015-02-28 NOTE — Patient Instructions (Signed)
Your physician recommends that you continue on your current medications as directed. Please refer to the Current Medication list given to you today. Device check 06/02/15. Your physician recommends that you schedule a follow-up appointment in: 1 year. Please schedule this appointment today.

## 2015-02-28 NOTE — Progress Notes (Signed)
PCP: Fredirick Maudlin, MD   Melanie [name] is a 70 y.o. female who presents today for routine electrophysiology followup.   She has advanced dementia and is not very active.  She is more alert and verbal than on prior visit.  History today is provided by her family.  She is unaware of any concerns of palpitations, chest pain, shortness of breath,  lower extremity edema, dizziness, presyncope, or syncope.    Past Medical History  Diagnosis Date  . Sinus node dysfunction     permanent pacemaker 10/06/2006  . Hypertension   . Paroxysmal atrial tachycardia     infrequent episodes  . Arthritis     rheumatoid  . Head injury, closed, with concussion   . Dementia    Past Surgical History  Procedure Laterality Date  . Appendectomy  1961  . Breast cyst excision  1970    left - benign  . Breast cyst excision  1972    right - benign  . Cyst excision  1989    behind ear  . Permanent pacemaker insertion  10/06/2006  . US echocardiography  06/03/2009    EF 55%,trace AI  . Nm myocar perf wall motion  06/04/2009    Normal    ROS- pt is unable to provide  Current Outpatient Prescriptions  Medication Sig Dispense Refill  . acetaminophen (TYLENOL) 500 MG tablet Take 1,000 mg by mouth every 6 (six) hours as needed.    . ENSURE PLUS (ENSURE PLUS) LIQD Take 237 mLs by mouth 3 (three) times daily between meals.    . folic acid (FOLVITE) 1 MG tablet Take 1 mg by mouth daily.      . Magnesium Hydroxide (MILK OF MAGNESIA PO) Take 30 mLs by mouth daily as needed.    . Melatonin 3 MG TABS Take 1 tablet by mouth daily.    . methotrexate 2.5 MG tablet Take 7.5 mg by mouth once a week. On saturday    . Multiple Vitamin (MULTIVITAMIN) tablet Take 1 tablet by mouth daily.     No current facility-administered medications for this visit.    Physical Exam: Filed Vitals:   02/28/15 0842  BP: 123/74  Pulse: 62  Height: 5\' 2"  (1.575 m)  Weight: 110 lb (49.896 kg)  SpO2: 99%    GEN- The patient is  elderly and ill appearing, alert but with advanced dementia  Head- normocephalic, atraumatic Eyes-  Sclera clear, conjunctiva pink Ears- hearing intact Oropharynx- clear Lungs- Clear to ausculation bilaterally, normal work of breathing Chest- pacemaker pocket is well healed Heart- Regular rate and rhythm  GI- soft, NT, ND, + BS Extremities- no clubbing, cyanosis, or edema  Pacemaker interrogation- reviewed in detail today,  See PACEART report  Assessment and Plan:  1. Sick sinus syndrome Normal pacemaker function See Art report No changes today 25 months estimated battery longevity  2. Atrial tachycardia Well controlled No changes at this time  3. HTN Stable No change required today   carelink every 3 months Return in 1 year  Arita Miss MD, [facility] 02/28/2015 9:21 AM

## 2015-06-02 ENCOUNTER — Encounter: Payer: Medicare Other | Admitting: *Deleted

## 2015-06-02 ENCOUNTER — Telehealth: Payer: Self-pay | Admitting: Cardiology

## 2015-06-02 NOTE — Telephone Encounter (Signed)
Confirmed remote transmission w/ pt sister.   

## 2015-06-06 ENCOUNTER — Encounter: Payer: Self-pay | Admitting: Cardiology

## 2016-05-07 ENCOUNTER — Encounter: Payer: Self-pay | Admitting: Internal Medicine

## 2016-07-15 ENCOUNTER — Encounter: Payer: Self-pay | Admitting: *Deleted

## 2016-07-16 ENCOUNTER — Telehealth: Payer: Self-pay | Admitting: Internal Medicine

## 2016-07-16 ENCOUNTER — Ambulatory Visit (INDEPENDENT_AMBULATORY_CARE_PROVIDER_SITE_OTHER): Payer: Medicare Other | Admitting: Internal Medicine

## 2016-07-16 ENCOUNTER — Encounter: Payer: Self-pay | Admitting: Internal Medicine

## 2016-07-16 VITALS — BP 108/73 | HR 87 | Ht 62.0 in | Wt 95.0 lb

## 2016-07-16 DIAGNOSIS — I495 Sick sinus syndrome: Secondary | ICD-10-CM

## 2016-07-16 DIAGNOSIS — I10 Essential (primary) hypertension: Secondary | ICD-10-CM | POA: Diagnosis not present

## 2016-07-16 NOTE — Progress Notes (Signed)
PCP: Timmie Foerster, MD  Melanie [name] is a 71 y.o. female who presents today for routine electrophysiology followup.  She has advanced dementia.  Not very active.  Today, she denies symptoms of palpitations, chest pain, shortness of breath,  lower extremity edema, dizziness, presyncope, or syncope.  The patient is otherwise without complaint today.   Past Medical History:  Diagnosis Date  . Arthritis    rheumatoid  . Dementia   . Head injury, closed, with concussion   . Hypertension   . Paroxysmal atrial tachycardia (HCC)    infrequent episodes  . Sinus node dysfunction (HCC)    permanent pacemaker 10/06/2006   Past Surgical History:  Procedure Laterality Date  . APPENDECTOMY  1961  . BREAST CYST EXCISION  1970   left - benign  . BREAST CYST EXCISION  1972   right - benign  . CYST EXCISION  1989   behind ear  . NM MYOCAR PERF WALL MOTION  06/04/2009   Normal  . PERMANENT PACEMAKER INSERTION  10/06/2006  . US ECHOCARDIOGRAPHY  06/03/2009   EF 55%,trace AI    ROS- all systems are reviewed and negative except as per HPI above  Current Outpatient Prescriptions  Medication Sig Dispense Refill  . acetaminophen (TYLENOL) 500 MG tablet Take 1,000 mg by mouth every 6 (six) hours as needed.    . calcium-vitamin D (OSCAL 500/200 D-3) 500-200 MG-UNIT tablet Take 1 tablet by mouth 3 (three) times daily.    Marland Kitchen ENSURE PLUS (ENSURE PLUS) LIQD Take 237 mLs by mouth 3 (three) times daily between meals.    . Folic Acid 0.8 MG CAPS Take 1 capsule by mouth daily.    . Magnesium Hydroxide (MILK OF MAGNESIA PO) Take 30 mLs by mouth every 6 (six) hours as needed.     . Melatonin 3 MG TABS Take 1 tablet by mouth daily.    . methotrexate 2.5 MG tablet Take 7.5 mg by mouth once a week. On saturday    . mirtazapine (REMERON) 7.5 MG tablet Take 7.5 mg by mouth at bedtime.     No current facility-administered medications for this visit.     Physical Exam: Vitals:   07/16/16 1100  BP: 108/73   Pulse: 87  SpO2: 99%  Weight: 95 lb (43.1 kg)  Height: 5\' 2"  (1.575 m)    GEN- The patient is well appearing, alert and interactive Head- normocephalic, atraumatic Eyes-  Sclera clear, conjunctiva pink Ears- hearing intact Oropharynx- clear Lungs- Clear to ausculation bilaterally, normal work of breathing Chest- pacemaker pocket is well healed Heart- Regular rate and rhythm, no murmurs, rubs or gallops, PMI not laterally displaced GI- soft, NT, ND, + BS Extremities- no clubbing, cyanosis, or edema  Pacemaker interrogation- reviewed in detail today,  See PACEART report  Assessment and Plan:  1. Sinus bradycardia/ sick sinus Normal pacemaker function See Pace Art report No changes today Approaching ERI Risks, benefits, and alternatives to pacemaker pulse generator replacement were discussed in detail today with her sister (POA).  She understands that risks include but are not limited to bleeding, infection, pneumothorax, perforation, tamponade, vascular damage, renal failure, MI, stroke, death, damage to his existing leads, and lead dislodgement.  We will schedule the procedure once ERI without requiring an additional office visit.  I have asked her sister  (POA) to be present for the procedure to sign consents.  The importance of monthly carelink transmissions from the [facility] was discussed with her sister today.  2. Atrial tachycardia Well controlled  3. HTN Stable No change required today   carelink every month Return in 1 year unless she reaches ERI in the interim  Hillis Range MD, [facility] 07/16/2016 11:49 AM

## 2016-07-16 NOTE — Telephone Encounter (Signed)
Spoke w/ pt and gave her instructions on how to send pt remote transmission.

## 2016-07-16 NOTE — Patient Instructions (Addendum)
Medication Instructions:   Your physician recommends that you continue on your current medications as directed. Please refer to the Current Medication list given to you today.  Labwork:  NONE  Testing/Procedures:  NONE  Follow-Up:  Your physician recommends that you schedule a follow-up appointment in: 1 year with Dr. Johney Frame. You will receive a reminder letter in the mail in about 10 months reminding you to call and schedule your appointment. If you don't receive this letter, please contact our office.  Any Other Special Instructions Will Be Listed Below (If Applicable).  Your next device check from home is 08/16/16. (please send this transmission due to battery reaching end of life per [place])  If you need a refill on your cardiac medications before your next appointment, please call your pharmacy.

## 2016-07-16 NOTE — Telephone Encounter (Signed)
Kathie Rhodes with the [facility] [place] called stating that patient does not have a remote home device at the facility. They are wanting to know if this can be ordered and sent directly to the [facility]. If so, it needs to go to the attention of Greg Cutter, [place].   Please call Harriett Sine - [phone] at the [facility].

## 2016-07-19 LAB — CUP PACEART INCLINIC DEVICE CHECK
Battery Remaining Longevity: 4 mo
Battery Voltage: 2.63 V
Brady Statistic AS VP Percent: 0 %
Brady Statistic AS VS Percent: 73 %
Implantable Lead Implant Date: 20080911
Implantable Lead Model: 5076
Lead Channel Impedance Value: 489 Ohm
Lead Channel Pacing Threshold Amplitude: 0.5 V
Lead Channel Pacing Threshold Amplitude: 1 V
Lead Channel Pacing Threshold Pulse Width: 0.4 ms
Lead Channel Pacing Threshold Pulse Width: 0.4 ms
Lead Channel Pacing Threshold Pulse Width: 0.4 ms
Lead Channel Sensing Intrinsic Amplitude: 1 mV
Lead Channel Sensing Intrinsic Amplitude: 8 mV
Lead Channel Setting Sensing Sensitivity: 2.8 mV
MDC IDC LEAD IMPLANT DT: 20080911
MDC IDC LEAD LOCATION: 753859
MDC IDC LEAD LOCATION: 753860
MDC IDC MSMT BATTERY IMPEDANCE: 5051 Ohm
MDC IDC MSMT LEADCHNL RA PACING THRESHOLD AMPLITUDE: 0.5 V
MDC IDC MSMT LEADCHNL RA PACING THRESHOLD PULSEWIDTH: 0.4 ms
MDC IDC MSMT LEADCHNL RV IMPEDANCE VALUE: 614 Ohm
MDC IDC MSMT LEADCHNL RV PACING THRESHOLD AMPLITUDE: 1 V
MDC IDC PG IMPLANT DT: 20080911
MDC IDC SESS DTM: 20180622154207
MDC IDC SET LEADCHNL RA PACING AMPLITUDE: 2 V
MDC IDC SET LEADCHNL RV PACING AMPLITUDE: 2.5 V
MDC IDC SET LEADCHNL RV PACING PULSEWIDTH: 0.4 ms
MDC IDC STAT BRADY AP VP PERCENT: 0 %
MDC IDC STAT BRADY AP VS PERCENT: 27 %

## 2016-08-16 ENCOUNTER — Encounter: Payer: Medicare Other | Admitting: *Deleted

## 2016-08-20 ENCOUNTER — Encounter: Payer: Self-pay | Admitting: Cardiology

## 2016-08-25 ENCOUNTER — Ambulatory Visit (INDEPENDENT_AMBULATORY_CARE_PROVIDER_SITE_OTHER): Payer: Self-pay | Admitting: *Deleted

## 2016-08-25 DIAGNOSIS — Z950 Presence of cardiac pacemaker: Secondary | ICD-10-CM

## 2016-08-26 ENCOUNTER — Encounter: Payer: Self-pay | Admitting: Cardiology

## 2016-08-26 NOTE — Progress Notes (Signed)
Remote pacemaker transmission.   

## 2016-08-27 LAB — CUP PACEART REMOTE DEVICE CHECK
Battery Impedance: 5767 Ohm
Battery Remaining Longevity: 1 mo
Battery Voltage: 2.61 V
Brady Statistic AP VP Percent: 0 %
Brady Statistic AS VP Percent: 1 %
Date Time Interrogation Session: 20180801183507
Implantable Lead Implant Date: 20080911
Implantable Lead Location: 753860
Implantable Lead Model: 5076
Implantable Pulse Generator Implant Date: 20080911
Lead Channel Impedance Value: 558 Ohm
Lead Channel Pacing Threshold Amplitude: 0.75 V
Lead Channel Pacing Threshold Pulse Width: 0.4 ms
Lead Channel Sensing Intrinsic Amplitude: 0.7 mV
Lead Channel Setting Pacing Amplitude: 2.5 V
Lead Channel Setting Pacing Pulse Width: 0.4 ms
MDC IDC LEAD IMPLANT DT: 20080911
MDC IDC LEAD LOCATION: 753859
MDC IDC MSMT LEADCHNL RV IMPEDANCE VALUE: 592 Ohm
MDC IDC MSMT LEADCHNL RV PACING THRESHOLD AMPLITUDE: 1 V
MDC IDC MSMT LEADCHNL RV PACING THRESHOLD PULSEWIDTH: 0.4 ms
MDC IDC MSMT LEADCHNL RV SENSING INTR AMPL: 5.6 mV
MDC IDC SET LEADCHNL RA PACING AMPLITUDE: 2 V
MDC IDC SET LEADCHNL RV SENSING SENSITIVITY: 2 mV
MDC IDC STAT BRADY AP VS PERCENT: 0 %
MDC IDC STAT BRADY AS VS PERCENT: 99 %

## 2016-09-24 ENCOUNTER — Telehealth: Payer: Self-pay | Admitting: Cardiology

## 2016-09-24 NOTE — Telephone Encounter (Signed)
LMOVM for pt to return call. ERI reached on 09-08-16

## 2016-09-28 ENCOUNTER — Ambulatory Visit (INDEPENDENT_AMBULATORY_CARE_PROVIDER_SITE_OTHER): Payer: Self-pay | Admitting: *Deleted

## 2016-09-28 DIAGNOSIS — Z950 Presence of cardiac pacemaker: Secondary | ICD-10-CM

## 2016-09-28 NOTE — Telephone Encounter (Signed)
Melanie [name]'s sister Melanie [name]) returning call. Made aware that Melanie [name]'s pacemaker reached ERI 09/08/16 and per Dr. Jenel Lucks 07/16/16 note this procedure was discussed and was to be scheduled without another OV once PPM reached ERI. Melanie [name] isn't sure about Melanie [name] having the procedure done at this time because she is now 81 lbs and isn't doing well. I advised her that I would send this information to [name].

## 2016-09-29 NOTE — Progress Notes (Signed)
Remote pacemaker transmission.   

## 2016-09-30 NOTE — Telephone Encounter (Signed)
Spoke w/ pt sister and she wanted an update. I informed pt sister that we are still waiting for MD recommendations. Pt sister verbalized understanding.

## 2016-10-07 LAB — CUP PACEART REMOTE DEVICE CHECK
Battery Impedance: 7164 Ohm
Brady Statistic RV Percent Paced: 1 %
Implantable Lead Implant Date: 20080911
Implantable Lead Implant Date: 20080911
Implantable Lead Location: 753860
Implantable Lead Model: 5076
Implantable Lead Model: 5076
Implantable Pulse Generator Implant Date: 20080911
Lead Channel Impedance Value: 632 Ohm
Lead Channel Impedance Value: 67 Ohm
Lead Channel Setting Pacing Amplitude: 2.5 V
Lead Channel Setting Pacing Pulse Width: 0.4 ms
Lead Channel Setting Sensing Sensitivity: 2.8 mV
MDC IDC LEAD LOCATION: 753859
MDC IDC MSMT BATTERY VOLTAGE: 2.62 V
MDC IDC SESS DTM: 20180831172212

## 2016-10-07 NOTE — Telephone Encounter (Signed)
Patient sister wants to discuss generator change out procedure.

## 2016-10-07 NOTE — Telephone Encounter (Signed)
Spoke with her sister and discussed

## 2016-10-08 NOTE — Telephone Encounter (Signed)
Informed sister of Dr. Jenel Lucks recommendation and advisement. Will hold off of generator change at this time being that pt is not dependent and she is not mobile at this time.  Sister of pt, DPR on file, is agreeable to plan.

## 2017-02-25 DEATH — deceased

## 2017-07-01 ENCOUNTER — Encounter: Payer: Self-pay | Admitting: Internal Medicine

## 2017-07-08 ENCOUNTER — Encounter: Payer: Self-pay | Admitting: Internal Medicine
